# Patient Record
Sex: Male | Born: 1980 | Race: Black or African American | Hispanic: No | Marital: Single | Smoking: Former smoker
Health system: Southern US, Community
[De-identification: ages and names within clinical notes are randomized; demographics above are authoritative.]

## PROBLEM LIST (undated history)

## (undated) DIAGNOSIS — R42 Dizziness and giddiness: Secondary | ICD-10-CM

## (undated) DIAGNOSIS — G44009 Cluster headache syndrome, unspecified, not intractable: Secondary | ICD-10-CM

## (undated) DIAGNOSIS — I10 Essential (primary) hypertension: Secondary | ICD-10-CM

## (undated) HISTORY — PX: KNEE ARTHROSCOPY: SHX127

## (undated) HISTORY — DX: Essential (primary) hypertension: I10

## (undated) HISTORY — DX: Dizziness and giddiness: R42

## (undated) HISTORY — DX: Cluster headache syndrome, unspecified, not intractable: G44.009

---

## 1999-06-03 ENCOUNTER — Encounter: Payer: Self-pay | Admitting: Emergency Medicine

## 1999-06-03 ENCOUNTER — Emergency Department (HOSPITAL_COMMUNITY): Admission: EM | Admit: 1999-06-03 | Discharge: 1999-06-03 | Payer: Self-pay | Admitting: Emergency Medicine

## 1999-09-24 ENCOUNTER — Other Ambulatory Visit: Admission: RE | Admit: 1999-09-24 | Discharge: 1999-09-24 | Payer: Self-pay | Admitting: Podiatry

## 2002-09-03 ENCOUNTER — Emergency Department (HOSPITAL_COMMUNITY): Admission: EM | Admit: 2002-09-03 | Discharge: 2002-09-03 | Payer: Self-pay | Admitting: Emergency Medicine

## 2004-09-26 ENCOUNTER — Emergency Department (HOSPITAL_COMMUNITY): Admission: EM | Admit: 2004-09-26 | Discharge: 2004-09-26 | Payer: Self-pay | Admitting: Family Medicine

## 2007-02-03 ENCOUNTER — Emergency Department (HOSPITAL_COMMUNITY): Admission: EM | Admit: 2007-02-03 | Discharge: 2007-02-03 | Payer: Self-pay | Admitting: Emergency Medicine

## 2009-04-01 ENCOUNTER — Emergency Department (HOSPITAL_COMMUNITY): Admission: EM | Admit: 2009-04-01 | Discharge: 2009-04-01 | Payer: Self-pay | Admitting: Family Medicine

## 2009-04-01 ENCOUNTER — Emergency Department (HOSPITAL_COMMUNITY): Admission: EM | Admit: 2009-04-01 | Discharge: 2009-04-01 | Payer: Self-pay | Admitting: Emergency Medicine

## 2010-08-27 LAB — COMPREHENSIVE METABOLIC PANEL
ALT: 39 U/L (ref 0–53)
AST: 34 U/L (ref 0–37)
Albumin: 4.2 g/dL (ref 3.5–5.2)
Alkaline Phosphatase: 55 U/L (ref 39–117)
BUN: 12 mg/dL (ref 6–23)
CO2: 26 mEq/L (ref 19–32)
Calcium: 9.3 mg/dL (ref 8.4–10.5)
Chloride: 105 mEq/L (ref 96–112)
Creatinine, Ser: 1.21 mg/dL (ref 0.4–1.5)
GFR calc Af Amer: >60 mL/min (ref >60)
GFR calc non Af Amer: >60 mL/min (ref >60)
Glucose, Bld: 108 mg/dL — ABNORMAL HIGH (ref 70–99)
Potassium: 3.6 mEq/L (ref 3.5–5.1)
Sodium: 139 mEq/L (ref 135–145)
Total Bilirubin: 0.8 mg/dL (ref 0.3–1.2)
Total Protein: 7.8 g/dL (ref 6.0–8.3)

## 2010-08-27 LAB — CBC
HCT: 40.8 % (ref 39.0–52.0)
HCT: 42 % (ref 39.0–52.0)
Hemoglobin: 14 g/dL (ref 13.0–17.0)
Hemoglobin: 14.1 g/dL (ref 13.0–17.0)
MCHC: 33.6 g/dL (ref 30.0–36.0)
MCHC: 34.3 g/dL (ref 30.0–36.0)
MCV: 93.1 fL (ref 78.0–100.0)
Platelets: 234 10*3/uL (ref 150–400)
Platelets: 251 10*3/uL (ref 150–400)
RBC: 4.51 MIL/uL (ref 4.22–5.81)
RDW: 11.7 % (ref 11.5–15.5)
RDW: 11.9 % (ref 11.5–15.5)
WBC: 8.3 10*3/uL (ref 4.0–10.5)

## 2010-08-27 LAB — POCT I-STAT, CHEM 8
BUN: 14 mg/dL (ref 6–23)
Calcium, Ion: 1.12 mmol/L (ref 1.12–1.32)
HCT: 44 % (ref 39.0–52.0)
Hemoglobin: 15 g/dL (ref 13.0–17.0)
Sodium: 140 mEq/L (ref 135–145)
TCO2: 24 mmol/L (ref 0–100)

## 2010-08-27 LAB — POCT URINALYSIS DIP (DEVICE)
Ketones, ur: NEGATIVE mg/dL
Nitrite: NEGATIVE
Protein, ur: 30 mg/dL — AB
pH: 6 (ref 5.0–8.0)

## 2010-08-27 LAB — DIFFERENTIAL
Basophils Absolute: 0 10*3/uL (ref 0.0–0.1)
Basophils Absolute: 0 10*3/uL (ref 0.0–0.1)
Basophils Relative: 0 % (ref 0–1)
Basophils Relative: 0 % (ref 0–1)
Eosinophils Relative: 0 % (ref 0–5)
Eosinophils Relative: 0 % (ref 0–5)
Lymphocytes Relative: 5 % — ABNORMAL LOW (ref 12–46)
Monocytes Absolute: 0.2 10*3/uL (ref 0.1–1.0)
Monocytes Absolute: 0.5 10*3/uL (ref 0.1–1.0)
Monocytes Relative: 6 % (ref 3–12)

## 2018-08-17 ENCOUNTER — Ambulatory Visit: Payer: Self-pay | Admitting: Osteopathic Medicine

## 2018-08-17 DIAGNOSIS — G8911 Acute pain due to trauma: Secondary | ICD-10-CM | POA: Diagnosis not present

## 2018-08-17 DIAGNOSIS — M25512 Pain in left shoulder: Secondary | ICD-10-CM | POA: Diagnosis not present

## 2018-09-12 ENCOUNTER — Ambulatory Visit: Payer: Self-pay | Admitting: Osteopathic Medicine

## 2018-09-12 DIAGNOSIS — M79602 Pain in left arm: Secondary | ICD-10-CM | POA: Diagnosis not present

## 2018-09-12 DIAGNOSIS — J069 Acute upper respiratory infection, unspecified: Secondary | ICD-10-CM | POA: Diagnosis not present

## 2018-09-12 DIAGNOSIS — M25512 Pain in left shoulder: Secondary | ICD-10-CM | POA: Diagnosis not present

## 2018-09-12 DIAGNOSIS — R0789 Other chest pain: Secondary | ICD-10-CM | POA: Diagnosis not present

## 2018-09-12 DIAGNOSIS — G8929 Other chronic pain: Secondary | ICD-10-CM | POA: Diagnosis not present

## 2018-09-12 DIAGNOSIS — R079 Chest pain, unspecified: Secondary | ICD-10-CM | POA: Diagnosis not present

## 2018-09-29 DIAGNOSIS — M25512 Pain in left shoulder: Secondary | ICD-10-CM | POA: Diagnosis not present

## 2018-10-23 DIAGNOSIS — L739 Follicular disorder, unspecified: Secondary | ICD-10-CM | POA: Diagnosis not present

## 2018-10-23 DIAGNOSIS — R21 Rash and other nonspecific skin eruption: Secondary | ICD-10-CM | POA: Diagnosis not present

## 2018-10-26 ENCOUNTER — Encounter: Payer: Self-pay | Admitting: Osteopathic Medicine

## 2018-10-26 ENCOUNTER — Other Ambulatory Visit: Payer: Self-pay | Admitting: Osteopathic Medicine

## 2018-10-26 ENCOUNTER — Ambulatory Visit (INDEPENDENT_AMBULATORY_CARE_PROVIDER_SITE_OTHER): Payer: BC Managed Care – PPO | Admitting: Osteopathic Medicine

## 2018-10-26 VITALS — BP 143/83 | HR 87 | Ht 70.87 in | Wt 276.0 lb

## 2018-10-26 DIAGNOSIS — I10 Essential (primary) hypertension: Secondary | ICD-10-CM | POA: Diagnosis not present

## 2018-10-26 DIAGNOSIS — Z23 Encounter for immunization: Secondary | ICD-10-CM | POA: Diagnosis not present

## 2018-10-26 MED ORDER — LISINOPRIL-HYDROCHLOROTHIAZIDE 10-12.5 MG PO TABS: 1.0000 | ORAL_TABLET | Freq: Every day | ORAL | 1 refills | Status: DC

## 2018-10-26 NOTE — Patient Instructions (Addendum)
Plan:   Will start a medicine for blood pressure  Will recheck blood pressure (and headaches/dizziness) next week  Depending on numbers then, may need to make adjustments to the medicines   Please call/message [place] with any questions or concerns!

## 2018-10-26 NOTE — Progress Notes (Signed)
HPI: Shawn Richardson is a 38 y.o. male who  has a past medical history of Hypertension.  he presents to [facility] today, 10/26/18,  for chief complaint of: New to establish  HTN Dizziness DM2?   Pleasant new patient here to establish care. Works as a Geneticist, molecular.   HTN: Has been high for awhile. On no meds. Few ER visits - records reviewed, BP high then, too: 158/96 in 07/2018, 167/94 on 10/23/2018. reports orthostatic lightheadedness past couple weeks, as well as headache. No vision change, no chest pain.        Past medical, surgical, social and family history reviewed:  There are no active problems to display for this patient.   History reviewed. No pertinent surgical history.  Social History   Tobacco Use  . Smoking status: Never Smoker  . Smokeless tobacco: Never Used  Substance Use Topics  . Alcohol use: Yes    History reviewed. No pertinent family history.   Current medication list and allergy/intolerance information reviewed:    NO meds.   No Known Allergies    Review of Systems:  Constitutional:  No  fever, no chills, No recent illness, No unintentional weight changes. No significant fatigue.   HEENT: No  headache, no vision change, no hearing change, No sore throat, No  sinus pressure  Cardiac: No  chest pain, No  pressure, No palpitations, No  Orthopnea  Respiratory:  No  shortness of breath. No  Cough  Gastrointestinal: No  abdominal pain, No  nausea, No  vomiting,  No  blood in stool, No  diarrhea, No  constipation   Musculoskeletal: No new myalgia/arthralgia  Skin: No  Rash, No other wounds/concerning lesions  Genitourinary: No  incontinence, No  abnormal genital bleeding, No abnormal genital discharge  Hem/Onc: No  easy bruising/bleeding, No  abnormal lymph node  Endocrine: No cold intolerance,  No heat intolerance. No polyuria/polydipsia/polyphagia   Neurologic: No  weakness, No  dizziness,  No  slurred speech/focal weakness/facial droop  Psychiatric: No  concerns with depression, No  concerns with anxiety, No sleep problems, No mood problems  Exam:  BP (!) 143/83   Pulse 87   Ht 5' 10.87" (1.8 m)   Wt 276 lb (125.2 kg)   SpO2 98%   BMI 38.64 kg/m   Constitutional: VS see above. General Appearance: alert, well-developed, well-nourished, NAD  Eyes: Normal lids and conjunctive, non-icteric sclera  Neck: No masses, trachea midline. No thyroid enlargement. No tenderness/mass appreciated. No lymphadenopathy  Respiratory: Normal respiratory effort. no wheeze, no rhonchi, no rales  Cardiovascular: S1/S2 normal, no murmur, no rub/gallop auscultated. RRR. No lower extremity edema.   Gastrointestinal: Nontender, no masses.  Musculoskeletal: Gait normal. No clubbing/cyanosis of digits.   Neurological: Normal balance/coordination. No tremor.   Skin: warm, dry, intact. No rash/ulcer. No concerning nevi or subq nodules on limited exam.    Psychiatric: Normal judgment/insight. Normal mood and affect. Oriented x3.    Lab results reviewed from recent ER visit 09/12/2018 CBC, CMP were okay.  Glucose was 125 but patient states was probably not fasting.     ASSESSMENT/PLAN: The primary encounter diagnosis was Essential hypertension. A diagnosis of Need for tetanus, diphtheria, and acellular pertussis (Tdap) vaccine in patient of adolescent age or older was also pertinent to this visit.   Orders Placed This Encounter  Procedures  . Tdap vaccine greater than or equal to 7yo IM    Meds ordered this encounter  Medications  .  DISCONTD: lisinopril-hydrochlorothiazide (ZESTORETIC) 10-12.5 MG tablet    Sig: Take 1 tablet by mouth daily.    Dispense:  30 tablet    Refill:  1    Patient Instructions  Plan:   Will start a medicine for blood pressure  Will recheck blood pressure (and headaches/dizziness) next week  Depending on numbers then, may need to make adjustments to  the medicines   Please call/message us with any questions or concerns!          Visit summary with medication list and pertinent instructions was printed for patient to review. All questions at time of visit were answered - patient instructed to contact office with any additional concerns or updates. ER/RTC precautions were reviewed with the patient.     Please note: voice recognition software was used to produce this document, and typos may escape review. Please contact Dr. Lyn HollingsheadAlexander for any needed clarifications.     Follow-up plan: Return in about 1 week (around 11/02/2018) for recheck BP, sooner if needed .

## 2018-10-27 ENCOUNTER — Other Ambulatory Visit: Payer: Self-pay | Admitting: Osteopathic Medicine

## 2018-10-27 ENCOUNTER — Telehealth: Payer: Self-pay | Admitting: Osteopathic Medicine

## 2018-10-27 MED ORDER — HYDROCHLOROTHIAZIDE 12.5 MG PO TABS: 12.5000 mg | ORAL_TABLET | Freq: Every day | ORAL | 1 refills | Status: DC

## 2018-10-27 MED ORDER — LISINOPRIL 10 MG PO TABS: 10.0000 mg | ORAL_TABLET | Freq: Every day | ORAL | 1 refills | Status: DC

## 2018-10-27 NOTE — Telephone Encounter (Signed)
Pt aware.

## 2018-10-27 NOTE — Telephone Encounter (Signed)
Pt called. [facility] does not have the Lisinopril-HCTZ in stock.  Thanks

## 2018-10-27 NOTE — Telephone Encounter (Signed)
Sent separate prescriptions, patient okay to take both medications together

## 2018-10-28 ENCOUNTER — Encounter: Payer: Self-pay | Admitting: Osteopathic Medicine

## 2018-11-02 ENCOUNTER — Ambulatory Visit (INDEPENDENT_AMBULATORY_CARE_PROVIDER_SITE_OTHER): Payer: BC Managed Care – PPO | Admitting: Osteopathic Medicine

## 2018-11-02 ENCOUNTER — Encounter: Payer: Self-pay | Admitting: Osteopathic Medicine

## 2018-11-02 VITALS — BP 124/82 | HR 94 | Temp 98.0°F | Wt 279.0 lb

## 2018-11-02 DIAGNOSIS — G44201 Tension-type headache, unspecified, intractable: Secondary | ICD-10-CM

## 2018-11-02 DIAGNOSIS — I10 Essential (primary) hypertension: Secondary | ICD-10-CM

## 2018-11-02 DIAGNOSIS — R42 Dizziness and giddiness: Secondary | ICD-10-CM

## 2018-11-02 MED ORDER — ASPIRIN-ACETAMINOPHEN-CAFFEINE 250-250-65 MG PO TABS: 1.0000 | ORAL_TABLET | Freq: Three times a day (TID) | ORAL | 0 refills | Status: DC | PRN

## 2018-11-02 MED ORDER — MECLIZINE HCL 25 MG PO TABS: 25.0000 mg | ORAL_TABLET | Freq: Three times a day (TID) | ORAL | 0 refills | Status: DC | PRN

## 2018-11-02 NOTE — Patient Instructions (Addendum)
Plan:  We will trial some medications for headache/dizziness. I am not sure what to tell you might be the cause of your symptoms other than unusual presentation of tension headache and vertigo.    I think it would be smart to get an MRI of the brain and seek a second opinion from a neurologist.  Orders have been placed for the MRI test and a referral to a specialist. You should be getting a call to schedule these.  MRI will probably be next Sunday or Monday, neurology appointment may depend on results or on their availability but hopefully we can get you seen within the next couple of weeks.  If symptoms totally resolve, we can always cancel that appointment.

## 2018-11-02 NOTE — Progress Notes (Signed)
HPI: Shawn Richardson is a 38 y.o. male who  has a past medical history of Hypertension.  he presents to [facility] today, 11/02/18,  for chief complaint of:  Follow-up HTN  At last visit, we started lisinopril-hydrochlorothiazide 10-12.5.  Patient states he has been taking medications consistently.  Doing well except still experiencing constant vertiginous dizziness described as spinning, it's there all day, no change w/ movement. Reports headache at base of skull/top of head. No chest pain or vision change.  Patient states that the headache is new for the past 3 days, constant, not waking him up at night, not different with any positional change, he did mention a headache at his initial visit last week, but he states that there was no headache at that time.  BP Readings from Last 3 Encounters:  11/02/18 124/82  10/26/18 (!) 143/83        At today's visit 11/02/18 ... PMH, PSH, FH reviewed and updated as needed.  Current medication list and allergy/intolerance hx reviewed and updated as needed. (See remainder of HPI, ROS, Phys Exam below)  Vision screening abnormal, see nurse notes.  Corrected vision not terrible but uncorrected vision is pretty bad especially in the right eye      ASSESSMENT/PLAN: The primary encounter diagnosis was Vertigo. Diagnoses of Intractable tension-type headache, unspecified chronicity pattern and Essential hypertension were also pertinent to this visit.   Orders Placed This Encounter  Procedures  . MR Brain W Wo Contrast  . Ambulatory referral to Neurology     Meds ordered this encounter  Medications  . aspirin-acetaminophen-caffeine (EXCEDRIN MIGRAINE) 250-250-65 MG tablet    Sig: Take 1-2 tablets by mouth every 8 (eight) hours as needed for headache.    Dispense:  30 tablet    Refill:  0  . meclizine (ANTIVERT) 25 MG tablet    Sig: Take 1 tablet (25 mg total) by mouth 3 (three) times daily as needed for  dizziness.    Dispense:  30 tablet    Refill:  0    Patient Instructions  Plan:  We will trial some medications for headache/dizziness. I am not sure what to tell you might be the cause of your symptoms other than unusual presentation of tension headache and vertigo.    I think it would be smart to get an MRI of the brain and seek a second opinion from a neurologist.  Orders have been placed for the MRI test and a referral to a specialist. You should be getting a call to schedule these.  MRI will probably be next Sunday or Monday, neurology appointment may depend on results or on their availability but hopefully we can get you seen within the next couple of weeks.  If symptoms totally resolve, we can always cancel that appointment.     Follow-up plan: Return for recheck based on results of MRI / if headache gets worse or changes. Otherwise 1 month recheck BP .                                                 ################################################# ################################################# ################################################# #################################################    Current Meds  Medication Sig  . hydrochlorothiazide (HYDRODIURIL) 12.5 MG tablet Take 1 tablet (12.5 mg total) by mouth daily.  Marland Kitchen lisinopril (ZESTRIL) 10 MG tablet Take 1 tablet (10 mg total) by  mouth daily.    No Known Allergies     Review of Systems:  Constitutional: No recent illness  HEENT: +headache, no vision change  Cardiac: No  chest pain, No  pressure, No palpitations  Respiratory:  No  shortness of breath. No  Cough  Gastrointestinal: No  abdominal pain, no change on bowel habits  Musculoskeletal: No new myalgia/arthralgia  Skin: No  Rash  Hem/Onc: No  easy bruising/bleeding, No  abnormal lumps/bumps  Neurologic: No  weakness, +Dizziness  Psychiatric: No  concerns with depression, No  concerns with  anxiety  Exam:  BP 124/82 (BP Location: Left Arm, Patient Position: Sitting, Cuff Size: Large)   Pulse 94   Temp 98 F (36.7 C) (Oral)   Wt 279 lb (126.6 kg)   BMI 39.06 kg/m   BP Readings from Last 3 Encounters:  11/02/18 124/82  10/26/18 (!) 143/83    Constitutional: VS see above. General Appearance: alert, well-developed, well-nourished, NAD  Eyes: Normal lids and conjunctive, non-icteric sclera  Ears, Nose, Mouth, Throat: MMM, Normal external inspection ears/nares/mouth/lips/gums.  Neck: No masses, trachea midline.   Respiratory: Normal respiratory effort. no wheeze, no rhonchi, no rales  Cardiovascular: S1/S2 normal, no murmur, no rub/gallop auscultated. RRR.   Musculoskeletal: Gait normal. Symmetric and independent movement of all extremities  Neurological: Normal balance/coordination. No tremor.  EOMI, PERRLA, no nystagmus.  Normal finger-to-nose, normal heel-to-shin, is 5 out of 5 in all 4 extremities, cranial nerves grossly intact, Dix-Hallpike maneuver negative bilaterally for nystagmus or for reproduction of symptoms  Skin: warm, dry, intact.   Psychiatric: Normal judgment/insight. Normal mood and affect. Oriented x3.       Visit summary with medication list and pertinent instructions was printed for patient to review, patient was advised to alert us if any updates are needed. All questions at time of visit were answered - patient instructed to contact office with any additional concerns. ER/RTC precautions were reviewed with the patient and understanding verbalized.    Please note: voice recognition software was used to produce this document, and typos may escape review. Please contact Dr. Lyn HollingsheadAlexander for any needed clarifications.    Follow up plan: Return for recheck based on results of MRI / if headache gets worse or changes. Otherwise 1 month recheck BP .

## 2018-11-04 ENCOUNTER — Telehealth: Payer: Self-pay

## 2018-11-04 DIAGNOSIS — I10 Essential (primary) hypertension: Secondary | ICD-10-CM

## 2018-11-04 NOTE — Telephone Encounter (Signed)
MRI approved for bran w w/o contrast. Pt needs recent lab work due to hypertension.   Labs ordered and left pt msg advising to have done next week

## 2018-11-07 NOTE — Telephone Encounter (Addendum)
No, labs need to be in the last 6 weeks.Today is the 8 week mark

## 2018-11-07 NOTE — Telephone Encounter (Signed)
He has labs on file in care everywhere from 09/12/18 could they use these?

## 2018-11-07 NOTE — Telephone Encounter (Signed)
Well dang. I tried! Thanks!

## 2018-11-15 ENCOUNTER — Ambulatory Visit (INDEPENDENT_AMBULATORY_CARE_PROVIDER_SITE_OTHER): Payer: BC Managed Care – PPO | Admitting: Osteopathic Medicine

## 2018-11-15 ENCOUNTER — Encounter: Payer: Self-pay | Admitting: Osteopathic Medicine

## 2018-11-15 VITALS — BP 122/77 | HR 90 | Temp 98.7°F | Wt 276.5 lb

## 2018-11-15 DIAGNOSIS — R079 Chest pain, unspecified: Secondary | ICD-10-CM | POA: Diagnosis not present

## 2018-11-15 DIAGNOSIS — R071 Chest pain on breathing: Secondary | ICD-10-CM | POA: Diagnosis not present

## 2018-11-15 DIAGNOSIS — R0789 Other chest pain: Secondary | ICD-10-CM

## 2018-11-15 MED ORDER — MELOXICAM 7.5 MG PO TABS: 7.5000 mg | ORAL_TABLET | Freq: Every day | ORAL | 0 refills | Status: DC

## 2018-11-15 NOTE — Patient Instructions (Signed)

## 2018-11-15 NOTE — Progress Notes (Signed)
HPI: Shawn Richardson is a 38 y.o. male who  has a past medical history of Hypertension.  he presents to [facility] today, 11/15/18,  for chief complaint of:  Chest pain  . Context: 2 mos ago went to [facility] for cc "chest pain," Dx URI, thinks might have same issue. No known COVID exposure.  . Location: L anterior chest  . Quality: soreness . Duration: 4 days  . Timing: intermittent, NOT associated w/ exertion . Modifying factors: sometimes worse with coughing  . Assoc signs/symptoms: no fever/chills, no LE edema. +coughing on occasion.      At today's visit 11/15/18 ... PMH, PSH, FH reviewed and updated as needed.  Current medication list and allergy/intolerance hx reviewed and updated as needed. (See remainder of HPI, ROS, Phys Exam below)   No results found.  No results found for this or any previous visit (from the past 72 hour(s)).   EKG interpretation: Rate: 79 Rhythm: sinus No ST/T changes concerning for acute ischemia/infarct  Previous EKG no tracings available but normal report EKG 09/12/18        ASSESSMENT/PLAN: The primary encounter diagnosis was Costochondral chest pain. A diagnosis of Chest pain at rest was also pertinent to this visit.  Orders Placed This Encounter  Procedures  . EKG 12-Lead     Meds ordered this encounter  Medications  . meloxicam (MOBIC) 7.5 MG tablet    Sig: Take 1 tablet (7.5 mg total) by mouth daily.    Dispense:  30 tablet    Refill:  0    Patient Instructions  Costochondritis Costochondritis is swelling and irritation (inflammation) of the tissue (cartilage) that connects your ribs to your breastbone (sternum). This causes pain in the front of your chest. Usually, the pain:  Starts gradually.  Is in more than one rib. This condition usually goes away on its own over time. Follow these instructions at home:  Do not do anything that makes your pain worse.  If directed, put ice  on the painful area: ? Put ice in a plastic bag. ? Place a towel between your skin and the bag. ? Leave the ice on for 20 minutes, 2-3 times a day.  If directed, put heat on the affected area as often as told by your doctor. Use the heat source that your doctor tells you to use, such as a moist heat pack or a heating pad. ? Place a towel between your skin and the heat source. ? Leave the heat on for 20-30 minutes. ? Take off the heat if your skin turns bright red. This is very important if you cannot feel pain, heat, or cold. You may have a greater risk of getting burned.  Take over-the-counter and prescription medicines only as told by your doctor.  Return to your normal activities as told by your doctor. Ask your doctor what activities are safe for you.  Keep all follow-up visits as told by your doctor. This is important. Contact a doctor if:  You have chills or a fever.  Your pain does not go away or it gets worse.  You have a cough that does not go away. Get help right away if:  You are short of breath. This information is not intended to replace advice given to you by your health care provider. Make sure you discuss any questions you have with your health care provider. Document Released: 10/28/2007 Document Revised: 11/29/2015 Document Reviewed: 09/04/2015 Elsevier Interactive Patient Education  2019 [employer]      Follow-up plan: Return if symptoms worsen or fail to improve please call us!Marland Kitchen.                                                 ################################################# ################################################# ################################################# #################################################    No outpatient medications have been marked as taking for the 11/15/18 encounter (Office Visit) with Sunnie NielsenAlexander, Cecilia Vancleve, DO.    No Known Allergies     Review of Systems:   Constitutional: No recent illness  HEENT: No  headache, no vision change  Cardiac: +chest pain, No  pressure, No palpitations  Respiratory:  No  shortness of breath. +Cough  Gastrointestinal: No  abdominal pain, no change on bowel habits  Musculoskeletal: No new myalgia/arthralgia  Skin: No  Rash  Neurologic: No  weakness, No  Dizziness   Exam:  BP 122/77 (BP Location: Left Arm, Patient Position: Sitting, Cuff Size: Large)   Pulse 90   Temp 98.7 F (37.1 C) (Oral)   Wt 276 lb 8 oz (125.4 kg)   BMI 38.71 kg/m   Constitutional: VS see above. General Appearance: alert, well-developed, well-nourished, NAD  Eyes: Normal lids and conjunctive, non-icteric sclera  Ears, Nose, Mouth, Throat: MMM, Normal external inspection ears/nares/mouth/lips/gums.  Neck: No masses, trachea midline.   Respiratory: Normal respiratory effort. no wheeze, no rhonchi, no rales  Cardiovascular: S1/S2 normal, no murmur, no rub/gallop auscultated. RRR.   Musculoskeletal: Gait normal. Symmetric and independent movement of all extremities. (+)tenderness to chest wall at area of pain on L sternal border, worse w/ deep breath  Neurological: Normal balance/coordination. No tremor.  Skin: warm, dry, intact.   Psychiatric: Normal judgment/insight. Normal mood and affect. Oriented x3.       Visit summary with medication list and pertinent instructions was printed for patient to review, patient was advised to alert us if any updates are needed. All questions at time of visit were answered - patient instructed to contact office with any additional concerns. ER/RTC precautions were reviewed with the patient and understanding verbalized.    Please note: voice recognition software was used to produce this document, and typos may escape review. Please contact Dr. Lyn HollingsheadAlexander for any needed clarifications.    Follow up plan: Return if symptoms worsen or fail to improve please call us!.Marland Kitchen

## 2018-11-23 ENCOUNTER — Other Ambulatory Visit: Payer: Self-pay | Admitting: Osteopathic Medicine

## 2018-11-26 DIAGNOSIS — R0789 Other chest pain: Secondary | ICD-10-CM | POA: Diagnosis not present

## 2018-11-26 DIAGNOSIS — I10 Essential (primary) hypertension: Secondary | ICD-10-CM | POA: Diagnosis not present

## 2018-11-26 DIAGNOSIS — M940 Chondrocostal junction syndrome [Tietze]: Secondary | ICD-10-CM | POA: Diagnosis not present

## 2018-11-26 DIAGNOSIS — S29011A Strain of muscle and tendon of front wall of thorax, initial encounter: Secondary | ICD-10-CM | POA: Diagnosis not present

## 2018-11-28 ENCOUNTER — Other Ambulatory Visit: Payer: Self-pay | Admitting: Osteopathic Medicine

## 2018-12-06 ENCOUNTER — Ambulatory Visit (INDEPENDENT_AMBULATORY_CARE_PROVIDER_SITE_OTHER): Payer: BC Managed Care – PPO | Admitting: Osteopathic Medicine

## 2018-12-06 ENCOUNTER — Telehealth: Payer: Self-pay | Admitting: Osteopathic Medicine

## 2018-12-06 ENCOUNTER — Other Ambulatory Visit: Payer: Self-pay

## 2018-12-06 ENCOUNTER — Encounter: Payer: Self-pay | Admitting: Osteopathic Medicine

## 2018-12-06 DIAGNOSIS — Z20822 Contact with and (suspected) exposure to covid-19: Secondary | ICD-10-CM

## 2018-12-06 DIAGNOSIS — R6889 Other general symptoms and signs: Secondary | ICD-10-CM | POA: Diagnosis not present

## 2018-12-06 DIAGNOSIS — Z20828 Contact with and (suspected) exposure to other viral communicable diseases: Secondary | ICD-10-CM

## 2018-12-06 NOTE — Addendum Note (Signed)
Addended by: Maryla Morrow on: 12/06/2018 09:43 AM   Modules accepted: Orders

## 2018-12-06 NOTE — Telephone Encounter (Signed)
Requests COVID testing, please arrange or advise him where he can go w/o appointment

## 2018-12-06 NOTE — Progress Notes (Signed)
Virtual Visit via Video (App used: Doximity) Note  I connected with      Shawn Richardson on 12/06/18 at 7:15 AM by a telemedicine application and verified that I am speaking with the correct person using two identifiers.  Patient is in his car in the parking lot after (+)answers for COVID precautions screening  I am in the office    I discussed the limitations of evaluation and management by telemedicine and the availability of in person appointments. The patient expressed understanding and agreed to proceed.  History of Present Illness: Shawn Richardson is a 38 y.o. male who would like to discuss follow-up headache/dizziness and BP, getting tested for COVID    Still having chest pains and headaches. Pt requests COVID testing. No fever or coughing. No home BP to report. Has appt w/ neurology but hasn't set up MRI       Observations/Objective: There were no vitals taken for this visit. BP Readings from Last 3 Encounters:  11/15/18 122/77  11/02/18 124/82  10/26/18 (!) 143/83   Exam: Normal Speech.  NAD  Lab and Radiology Results No results found for this or any previous visit (from the past 72 hour(s)). No results found.     Assessment and Plan: 38 y.o. male with The encounter diagnosis was Suspected Covid-19 Virus Infection.  Sent for drive-through testing for COVID  Will ask radiology to set up appt for MRI pending COVID results or can defer to neurology  PDMP not reviewed this encounter. Orders Placed This Encounter  Procedures  . Novel Coronavirus, NAA ([facility])  Drive up testing site only    Order Specific Question:   Known Exposure    Answer:   No   No orders of the defined types were placed in this encounter.  There are no Patient Instructions on file for this visit.     Follow Up Instructions: Return for RECHECK PENDING RESULTS / IF WORSE OR CHANGE.    I discussed the assessment and treatment plan with the patient. The patient was provided an  opportunity to ask questions and all were answered. The patient agreed with the plan and demonstrated an understanding of the instructions.   The patient was advised to call back or seek an in-person evaluation if any new concerns, if symptoms worsen or if the condition fails to improve as anticipated.  15 minutes of non-face-to-face time was provided during this encounter.                      Historical information moved to improve visibility of documentation.  Past Medical History:  Diagnosis Date  . Hypertension    No past surgical history on file. Social History   Tobacco Use  . Smoking status: Never Smoker  . Smokeless tobacco: Never Used  Substance Use Topics  . Alcohol use: Yes   family history is not on file.  Medications: Current Outpatient Medications  Medication Sig Dispense Refill  . aspirin-acetaminophen-caffeine (EXCEDRIN MIGRAINE) 250-250-65 MG tablet Take 1-2 tablets by mouth every 8 (eight) hours as needed for headache. 30 tablet 0  . hydrochlorothiazide (HYDRODIURIL) 12.5 MG tablet TAKE 1 TABLET(12.5 MG) BY MOUTH DAILY 90 tablet 1  . lisinopril (ZESTRIL) 10 MG tablet TAKE 1 TABLET(10 MG) BY MOUTH DAILY 90 tablet 1  . meclizine (ANTIVERT) 25 MG tablet Take 1 tablet (25 mg total) by mouth 3 (three) times daily as needed for dizziness. 30 tablet 0  . meloxicam (MOBIC) 7.5  MG tablet Take 1 tablet (7.5 mg total) by mouth daily. 30 tablet 0   No current facility-administered medications for this visit.    No Known Allergies  PDMP not reviewed this encounter. Orders Placed This Encounter  Procedures  . Novel Coronavirus, NAA ([facility])  Drive up testing site only    Order Specific Question:   Known Exposure    Answer:   No   No orders of the defined types were placed in this encounter.

## 2018-12-06 NOTE — Telephone Encounter (Signed)
Attempted to reach pt, no answer. Left vm to call back M-F 7am-7pm  Re-ordered lab as it needs to be future for the testing sites

## 2018-12-07 ENCOUNTER — Other Ambulatory Visit: Payer: BC Managed Care – PPO

## 2018-12-10 LAB — NOVEL CORONAVIRUS, NAA: SARS-CoV-2, NAA: NOT DETECTED

## 2018-12-12 ENCOUNTER — Other Ambulatory Visit: Payer: Self-pay | Admitting: Osteopathic Medicine

## 2018-12-12 NOTE — Telephone Encounter (Signed)
Please review for refill- patient at [facility] 

## 2018-12-21 ENCOUNTER — Telehealth: Payer: Self-pay | Admitting: Neurology

## 2018-12-21 ENCOUNTER — Ambulatory Visit: Payer: BC Managed Care – PPO | Admitting: Neurology

## 2018-12-21 NOTE — Telephone Encounter (Signed)
This patient did not show for new patient appointment today. 

## 2018-12-22 ENCOUNTER — Other Ambulatory Visit: Payer: Self-pay | Admitting: Osteopathic Medicine

## 2018-12-23 ENCOUNTER — Other Ambulatory Visit: Payer: Self-pay

## 2018-12-23 ENCOUNTER — Encounter: Payer: Self-pay | Admitting: Family Medicine

## 2018-12-23 ENCOUNTER — Other Ambulatory Visit: Payer: Self-pay | Admitting: *Deleted

## 2018-12-23 ENCOUNTER — Ambulatory Visit (INDEPENDENT_AMBULATORY_CARE_PROVIDER_SITE_OTHER): Payer: BC Managed Care – PPO | Admitting: Family Medicine

## 2018-12-23 VITALS — BP 137/81 | HR 80 | Ht 71.0 in | Wt 278.0 lb

## 2018-12-23 DIAGNOSIS — R071 Chest pain on breathing: Secondary | ICD-10-CM

## 2018-12-23 DIAGNOSIS — R0789 Other chest pain: Secondary | ICD-10-CM

## 2018-12-23 MED ORDER — PREDNISONE 20 MG PO TABS: 40.0000 mg | ORAL_TABLET | Freq: Every day | ORAL | 0 refills | Status: AC

## 2018-12-23 NOTE — Patient Instructions (Signed)
Costochondritis  Costochondritis is swelling and irritation (inflammation) of the tissue (cartilage) that connects your ribs to your breastbone (sternum). This causes pain in the front of your chest. The pain usually starts gradually and involves more than one rib. What are the causes? The exact cause of this condition is not always known. It results from stress on the cartilage where your ribs attach to your sternum. The cause of this stress could be:  Chest injury (trauma).  Exercise or activity, such as lifting.  Severe coughing. What increases the risk? You may be at higher risk for this condition if you:  Are male.  Are 30?40 years old.  Recently started a new exercise or work activity.  Have low levels of vitamin D.  Have a condition that makes you cough frequently. What are the signs or symptoms? The main symptom of this condition is chest pain. The pain:  Usually starts gradually and can be sharp or dull.  Gets worse with deep breathing, coughing, or exercise.  Gets better with rest.  May be worse when you press on the sternum-rib connection (tenderness). How is this diagnosed? This condition is diagnosed based on your symptoms, medical history, and a physical exam. Your health care provider will check for tenderness when pressing on your sternum. This is the most important finding. You may also have tests to rule out other causes of chest pain. These may include:  A chest X-ray to check for lung problems.  An electrocardiogram (ECG) to see if you have a heart problem that could be causing the pain.  An imaging scan to rule out a chest or rib fracture. How is this treated? This condition usually goes away on its own over time. Your health care provider may prescribe an NSAID to reduce pain and inflammation. Your health care provider may also suggest that you:  Rest and avoid activities that make pain worse.  Apply heat or cold to the area to reduce pain and  inflammation.  Do exercises to stretch your chest muscles. If these treatments do not help, your health care provider may inject a numbing medicine at the sternum-rib connection to help relieve the pain. Follow these instructions at home:  Avoid activities that make pain worse. This includes any activities that use chest, abdominal, and side muscles.  If directed, put ice on the painful area: ? Put ice in a plastic bag. ? Place a towel between your skin and the bag. ? Leave the ice on for 20 minutes, 2-3 times a day.  If directed, apply heat to the affected area as often as told by your health care provider. Use the heat source that your health care provider recommends, such as a moist heat pack or a heating pad. ? Place a towel between your skin and the heat source. ? Leave the heat on for 20-30 minutes. ? Remove the heat if your skin turns bright red. This is especially important if you are unable to feel pain, heat, or cold. You may have a greater risk of getting burned.  Take over-the-counter and prescription medicines only as told by your health care provider.  Return to your normal activities as told by your health care provider. Ask your health care provider what activities are safe for you.  Keep all follow-up visits as told by your health care provider. This is important. Contact a health care provider if:  You have chills or a fever.  Your pain does not go away or it gets   worse.  You have a cough that does not go away (is persistent). Get help right away if:  You have shortness of breath. This information is not intended to replace advice given to you by your health care provider. Make sure you discuss any questions you have with your health care provider. Document Released: 02/18/2005 Document Revised: 05/26/2017 Document Reviewed: 09/04/2015 Elsevier Patient Education  2020 Elsevier Inc.  

## 2018-12-23 NOTE — Progress Notes (Signed)
Acute Office Visit  Subjective:    Patient ID: Shawn Richardson, male    DOB: Sep 21, 1980, 38 y.o.   MRN: [id number]  Chief Complaint  Patient presents with  . Chest Pain    HPI Patient is in today for left sided chest pain that has been going on for a couple of months.  He is been seen for it previously in fact he has gone to urgent care and also seen his primary care provider here in our office.  He has had a chest x-ray and EKG.  Cardiac and pulmonary disease have been ruled out.  And almost days just on the chest wall on the left side.  No radiation.  No numbness or tingling in the hands.  No shortness of breath.  No chest tightness.  No lightheadedness or dizziness.  He was given a prescription for meloxicam and says it actually really was helpful.  He came back in today bc he would like a refill on the medication.  Denies worsening symptoms with eating.  Denies any heartburn or reflux symptoms.  Past Medical History:  Diagnosis Date  . Hypertension     No past surgical history on file.  No family history on file.  Social History   Socioeconomic History  . Marital status: Single    Spouse name: Not on file  . Number of children: Not on file  . Years of education: Not on file  . Highest education level: Not on file  Occupational History  . Not on file  Social Needs  . Financial resource strain: Not on file  . Food insecurity    Worry: Not on file    Inability: Not on file  . Transportation needs    Medical: Not on file    Non-medical: Not on file  Tobacco Use  . Smoking status: Never Smoker  . Smokeless tobacco: Never Used  Substance and Sexual Activity  . Alcohol use: Yes  . Drug use: Never  . Sexual activity: Yes  Lifestyle  . Physical activity    Days per week: Not on file    Minutes per session: Not on file  . Stress: Not on file  Relationships  . Social Herbalist on phone: Not on file    Gets together: Not on file    Attends religious  service: Not on file    Active member of club or organization: Not on file    Attends meetings of clubs or organizations: Not on file    Relationship status: Not on file  . Intimate partner violence    Fear of current or ex partner: Not on file    Emotionally abused: Not on file    Physically abused: Not on file    Forced sexual activity: Not on file  Other Topics Concern  . Not on file  Social History Narrative  . Not on file    Outpatient Medications Prior to Visit  Medication Sig Dispense Refill  . aspirin-acetaminophen-caffeine (EXCEDRIN MIGRAINE) 250-250-65 MG tablet Take 1-2 tablets by mouth every 8 (eight) hours as needed for headache. 30 tablet 0  . hydrochlorothiazide (HYDRODIURIL) 12.5 MG tablet TAKE 1 TABLET(12.5 MG) BY MOUTH DAILY 90 tablet 1  . lisinopril (ZESTRIL) 10 MG tablet TAKE 1 TABLET(10 MG) BY MOUTH DAILY 90 tablet 1  . meloxicam (MOBIC) 7.5 MG tablet TAKE 1 TABLET(7.5 MG) BY MOUTH DAILY 30 tablet 0  . meclizine (ANTIVERT) 25 MG tablet Take 1 tablet (25  mg total) by mouth 3 (three) times daily as needed for dizziness. 30 tablet 0  . methocarbamol (ROBAXIN) 750 MG tablet      No facility-administered medications prior to visit.     No Known Allergies  ROS     Objective:    Physical Exam  Constitutional: He is oriented to person, place, and time. He appears well-developed and well-nourished.  HENT:  Head: Normocephalic and atraumatic.  Right Ear: External ear normal.  Left Ear: External ear normal.  Eyes: Conjunctivae are normal.  Neck: Neck supple.  Cardiovascular: Normal rate, regular rhythm and normal heart sounds.  Pulmonary/Chest: Effort normal and breath sounds normal. He exhibits tenderness.  He is tender over the left chest wall where the ribs meet the sternum with palpitation.   Neurological: He is alert and oriented to person, place, and time.  Skin: Skin is warm and dry.  Psychiatric: He has a normal mood and affect. His behavior is normal.  Thought content normal.    BP 137/81   Pulse 80   Ht 5\' 11"  (1.803 m)   Wt 278 lb (126.1 kg)   SpO2 100%   BMI 38.77 kg/m  Wt Readings from Last 3 Encounters:  12/23/18 278 lb (126.1 kg)  11/15/18 276 lb 8 oz (125.4 kg)  11/02/18 279 lb (126.6 kg)    Health Maintenance Due  Topic Date Due  . HIV Screening  02/03/1995    There are no preventive care reminders to display for this patient.   No results found for: TSH Lab Results  Component Value Date   WBC 12.1 (H) 04/01/2009   HGB 15.0 04/01/2009   HCT 44.0 04/01/2009   MCV 92.0 04/01/2009   PLT 251 04/01/2009   Lab Results  Component Value Date   NA 140 04/01/2009   K 3.9 04/01/2009   CO2 26 04/01/2009   GLUCOSE 115 (H) 04/01/2009   BUN 14 04/01/2009   CREATININE 1.2 04/01/2009   BILITOT 0.8 04/01/2009   ALKPHOS 55 04/01/2009   AST 34 04/01/2009   ALT 39 04/01/2009   PROT 7.8 04/01/2009   ALBUMIN 4.2 04/01/2009   CALCIUM 9.3 04/01/2009   No results found for: CHOL No results found for: HDL No results found for: LDLCALC No results found for: TRIG No results found for: CHOLHDL No results found for: [id number]     Assessment & Plan:   Problem List Items Addressed This Visit    None    Visit Diagnoses    Costochondral chest pain    -  Primary     Do feel reassured that his pain is most consistent with costochondritis.  It looks like his medication was actually refilled about a week ago and we did call the pharmacy to confirm that they do have a prescription.  We also discussed maybe a short-term use of prednisone to see if that would be helpful.  I just encouraged him to hold off on using the meloxicam until he finishes the prednisone.  Did warn about potential side effects.  Call if any problems or if not improving over the next month please let me know.  Did recommend that he avoid any heavy lifting.  He says he does do some heavy lifting periodically.  Meds ordered this encounter  Medications  .  predniSONE (DELTASONE) 20 MG tablet    Sig: Take 2 tablets (40 mg total) by mouth daily with breakfast for 5 days.    Dispense:  10 tablet  Refill:  0     Beatrice Lecher, MD

## 2018-12-23 NOTE — Progress Notes (Signed)
perPt reports that he has Costochondral CP. He stated that the pain comes and goes, denies any SOB, pain radiating from jaw, neck or arm, no  f/s/c/n/v/d, no issues w/issues after eating or drinking.  He stated that he was given Mobic 7.5 mg for this to take qd and this helped him with the pain and he ran out and the pain returned. I asked if he had tried IBU he stated that he did and that it didn't work as well as the [employer]. I advised him that his last refill was sent on 12/12/2018 he said that he was unaware of this.  Called the pharmacy and the script is ready for p/u.Elouise Munroe, [place]

## 2019-01-19 ENCOUNTER — Other Ambulatory Visit: Payer: Self-pay | Admitting: Osteopathic Medicine

## 2019-01-26 ENCOUNTER — Other Ambulatory Visit: Payer: Self-pay

## 2019-01-26 ENCOUNTER — Encounter: Payer: Self-pay | Admitting: Osteopathic Medicine

## 2019-01-26 ENCOUNTER — Ambulatory Visit (INDEPENDENT_AMBULATORY_CARE_PROVIDER_SITE_OTHER): Payer: BC Managed Care – PPO | Admitting: Osteopathic Medicine

## 2019-01-26 VITALS — BP 134/81 | HR 81 | Temp 98.4°F | Wt 274.3 lb

## 2019-01-26 DIAGNOSIS — R1011 Right upper quadrant pain: Secondary | ICD-10-CM | POA: Diagnosis not present

## 2019-01-26 DIAGNOSIS — R1032 Left lower quadrant pain: Secondary | ICD-10-CM

## 2019-01-26 DIAGNOSIS — A0472 Enterocolitis due to Clostridium difficile, not specified as recurrent: Secondary | ICD-10-CM

## 2019-01-26 DIAGNOSIS — R197 Diarrhea, unspecified: Secondary | ICD-10-CM

## 2019-01-26 NOTE — Patient Instructions (Signed)
Plan:  Prolonged issues with loose stool give [place] concern for potential infection of the colon such as colitis or diverticulitis, possibly infection with parasite or C. difficile colitis.  Given the location of the right upper quadrant pain to however, I am suspicious for possible liver/gallbladder illness or even a pancreatic inflammation.  Like to get some blood work today as well as stool studies to evaluate for potential infection.  I like to get a CT scan of the abdomen done today or tomorrow so that we can look at the organs  Okay to use Imodium/loperamide as directed for now.  Stay well-hydrated.  Further treatment will depend on test results!  If severe abdominal pain, especially if fever, nausea/vomiting, please seek emergency medical attention if needed.

## 2019-01-26 NOTE — Progress Notes (Signed)
HPI: Shawn Richardson is a 38 y.o. male who  has a past medical history of Hypertension.  he presents to [facility] today, 01/26/19,  for chief complaint of:  Abdominal pain, diarrhea  . Context: No recent travel, changes to diet, or other triggers he can think of . Location/Quality: Bowel movements with loose stool, no blood or black stool, bowel movements about 4-5 times per day.  Associated with sharp intermittent right upper quadrant pain, duller and less frequent left lower quadrant pain.  No rectal pain with defecation. . Duration: About 3 weeks . Modifying factors: Over-the-counter medications tried     At today's visit 01/26/19 ... PMH, PSH, FH reviewed and updated as needed.  Current medication list and allergy/intolerance hx reviewed and updated as needed. (See remainder of HPI, ROS, Phys Exam below)   No results found.  No results found for this or any previous visit (from the past 72 hour(s)).        ASSESSMENT/PLAN: The primary encounter diagnosis was Right upper quadrant abdominal pain. Diagnoses of Left lower quadrant abdominal pain and Diarrhea of presumed infectious origin were also pertinent to this visit.   Orders Placed This Encounter  Procedures  . Stool Giardia/Cryptosporidium  . Ova and parasite examination  . CT Abdomen Pelvis W Contrast  . CBC with Differential/Platelet  . COMPLETE METABOLIC PANEL WITH GFR  . Gamma GT  . Lipase  . Stool, WBC/Lactoferrin  . C. difficile GDH and Toxin A/B     No orders of the defined types were placed in this encounter.   Patient Instructions  Plan:  Prolonged issues with loose stool give [place] concern for potential infection of the colon such as colitis or diverticulitis, possibly infection with parasite or C. difficile colitis.  Given the location of the right upper quadrant pain to however, I am suspicious for possible liver/gallbladder illness or even a pancreatic  inflammation.  Like to get some blood work today as well as stool studies to evaluate for potential infection.  I like to get a CT scan of the abdomen done today or tomorrow so that we can look at the organs  Okay to use Imodium/loperamide as directed for now.  Stay well-hydrated.  Further treatment will depend on test results!  If severe abdominal pain, especially if fever, nausea/vomiting, please seek emergency medical attention if needed.       Follow-up plan: Return if symptoms worsen or fail to improve / depending on results.                                                 ################################################# ################################################# ################################################# #################################################    Current Meds  Medication Sig  . aspirin-acetaminophen-caffeine (EXCEDRIN MIGRAINE) [phone] MG tablet Take 1-2 tablets by mouth every 8 (eight) hours as needed for headache.  . hydrochlorothiazide (HYDRODIURIL) 12.5 MG tablet TAKE 1 TABLET(12.5 MG) BY MOUTH DAILY  . lisinopril (ZESTRIL) 10 MG tablet TAKE 1 TABLET(10 MG) BY MOUTH DAILY  . meloxicam (MOBIC) 7.5 MG tablet TAKE 1 TABLET(7.5 MG) BY MOUTH DAILY    No Known Allergies     Review of Systems:  Constitutional: No recent illness  HEENT: No  headache, no vision change  Cardiac: No  chest pain, No  pressure, No palpitations  Respiratory:  No  shortness of breath. No  Cough  Gastrointestinal: +abdominal pain, +change on bowel habits, NO nausea or vomiting,  Musculoskeletal: No new myalgia/arthralgia  Skin: No  Rash  Neurologic: No  weakness, No  Dizziness   Exam:  BP 134/81 (BP Location: Left Arm, Patient Position: Sitting, Cuff Size: Large)   Pulse 81   Temp 98.4 F (36.9 C) (Oral)   Wt 274 lb 4.8 oz (124.4 kg)   BMI 38.26 kg/m   Constitutional: VS see above. General Appearance:  alert, well-developed, well-nourished, NAD  Eyes: Normal lids and conjunctive, non-icteric sclera  Ears, Nose, Mouth, Throat: MMM, Normal external inspection ears/nares/mouth/lips/gums.  Neck: No masses, trachea midline.   Respiratory: Normal respiratory effort. no wheeze, no rhonchi, no rales  Cardiovascular: S1/S2 normal, no murmur, no rub/gallop auscultated. RRR.   Musculoskeletal: Gait normal. Symmetric and independent movement of all extremities  Abdominal: (+)TTP RUQ and LLQ wihtout rebound or guarding, non-distended, no appreciable organomegaly though habitus limits exam. neg Murphy's, BS WNLx4  Neurological: Normal balance/coordination. No tremor.  Skin: warm, dry, intact.   Psychiatric: Normal judgment/insight. Normal mood and affect. Oriented x3.       Visit summary with medication list and pertinent instructions was printed for patient to review, patient was advised to alert us if any updates are needed. All questions at time of visit were answered - patient instructed to contact office with any additional concerns. ER/RTC precautions were reviewed with the patient and understanding verbalized.    Please note: voice recognition software was used to produce this document, and typos may escape review. Please contact Dr. Lyn HollingsheadAlexander for any needed clarifications.    Follow up plan: Return if symptoms worsen or fail to improve / depending on results.

## 2019-01-27 ENCOUNTER — Ambulatory Visit (INDEPENDENT_AMBULATORY_CARE_PROVIDER_SITE_OTHER): Payer: BC Managed Care – PPO

## 2019-01-27 ENCOUNTER — Other Ambulatory Visit: Payer: Self-pay

## 2019-01-27 DIAGNOSIS — R1032 Left lower quadrant pain: Secondary | ICD-10-CM

## 2019-01-27 DIAGNOSIS — R197 Diarrhea, unspecified: Secondary | ICD-10-CM | POA: Diagnosis not present

## 2019-01-27 DIAGNOSIS — R1011 Right upper quadrant pain: Secondary | ICD-10-CM | POA: Diagnosis not present

## 2019-01-27 LAB — COMPLETE METABOLIC PANEL WITH GFR
AG Ratio: 1.4 (calc) (ref 1.0–2.5)
ALT: 38 U/L (ref 9–46)
AST: 23 U/L (ref 10–40)
Albumin: 4.1 g/dL (ref 3.6–5.1)
Alkaline phosphatase (APISO): 57 U/L (ref 36–130)
BUN: 14 mg/dL (ref 7–25)
CO2: 28 mmol/L (ref 20–32)
Calcium: 9.7 mg/dL (ref 8.6–10.3)
Chloride: 104 mmol/L (ref 98–110)
Creat: 1.26 mg/dL (ref 0.60–1.35)
GFR, Est African American: 83 mL/min/{1.73_m2} (ref >=60)
GFR, Est Non African American: 72 mL/min/{1.73_m2} (ref >=60)
Globulin: 2.9 g/dL (calc) (ref 1.9–3.7)
Glucose, Bld: 116 mg/dL — ABNORMAL HIGH (ref 65–99)
Potassium: 4.9 mmol/L (ref 3.5–5.3)
Sodium: 138 mmol/L (ref 135–146)
Total Bilirubin: 0.6 mg/dL (ref 0.2–1.2)
Total Protein: 7 g/dL (ref 6.1–8.1)

## 2019-01-27 LAB — CBC WITH DIFFERENTIAL/PLATELET
Absolute Monocytes: 582 cells/uL (ref 200–950)
Basophils Absolute: 19 cells/uL (ref 0–200)
Basophils Relative: 0.3 %
Eosinophils Absolute: 13 cells/uL — ABNORMAL LOW (ref 15–500)
Eosinophils Relative: 0.2 %
HCT: 40.7 % (ref 38.5–50.0)
Hemoglobin: 13 g/dL — ABNORMAL LOW (ref 13.2–17.1)
Lymphs Abs: 1408 cells/uL (ref 850–3900)
MCH: 29.4 pg (ref 27.0–33.0)
MCHC: 31.9 g/dL — ABNORMAL LOW (ref 32.0–36.0)
MCV: 92.1 fL (ref 80.0–100.0)
MPV: 10.2 fL (ref 7.5–12.5)
Monocytes Relative: 9.1 %
Neutro Abs: 4378 cells/uL (ref 1500–7800)
Neutrophils Relative %: 68.4 %
Platelets: 352 10*3/uL (ref 140–400)
RBC: 4.42 10*6/uL (ref 4.20–5.80)
RDW: 11.4 % (ref 11.0–15.0)
Total Lymphocyte: 22 %
WBC: 6.4 10*3/uL (ref 3.8–10.8)

## 2019-01-27 LAB — LIPASE: Lipase: 28 U/L (ref 7–60)

## 2019-01-27 LAB — GAMMA GT: GGT: 30 U/L (ref 3–90)

## 2019-01-27 MED ORDER — IOHEXOL 300 MG/ML  SOLN
100.0000 mL | Freq: Once | INTRAMUSCULAR | Status: AC | PRN
  Administered 2019-01-27: 11:00:00 100 mL via INTRAVENOUS

## 2019-01-29 ENCOUNTER — Encounter: Payer: Self-pay | Admitting: Osteopathic Medicine

## 2019-01-31 DIAGNOSIS — A0472 Enterocolitis due to Clostridium difficile, not specified as recurrent: Secondary | ICD-10-CM | POA: Insufficient documentation

## 2019-01-31 MED ORDER — VANCOMYCIN HCL 125 MG PO CAPS: 125.0000 mg | ORAL_CAPSULE | Freq: Four times a day (QID) | ORAL | 0 refills | Status: DC

## 2019-01-31 NOTE — Addendum Note (Signed)
Addended by: Maryla Morrow on: 01/31/2019 10:26 AM   Modules accepted: Orders

## 2019-02-01 ENCOUNTER — Encounter: Payer: Self-pay | Admitting: Osteopathic Medicine

## 2019-02-01 LAB — C. DIFFICILE GDH AND TOXIN A/B
GDH ANTIGEN: DETECTED
MICRO NUMBER:: 850349
SPECIMEN QUALITY:: ADEQUATE
TOXIN A AND B: NOT DETECTED

## 2019-02-01 LAB — OVA AND PARASITE EXAMINATION
CONCENTRATE RESULT:: NONE SEEN
MICRO NUMBER:: 853360
SPECIMEN QUALITY:: ADEQUATE
TRICHROME RESULT:: NONE SEEN

## 2019-02-01 LAB — FECAL LACTOFERRIN, QUANT
Fecal Lactoferrin: NEGATIVE
MICRO NUMBER:: 850331
SPECIMEN QUALITY:: ADEQUATE

## 2019-02-01 LAB — CLOSTRIDIUM DIFFICILE TOXIN B, QUALITATIVE, REAL-TIME PCR: Toxigenic C. Difficile by PCR: DETECTED — AB

## 2019-02-01 NOTE — Telephone Encounter (Signed)
I called the patient and he is ok with the medication change to Flagyl per PCP recommendation. He did not have any other questions. PCP sent in new medication.

## 2019-02-01 NOTE — Telephone Encounter (Signed)
I sent oral vancomycin for him to treat C-diff colitis. I looked up Good[facility] it's still pretty expensive. Is this something we need to do a PA for or is this a deductible issue? If he's stuck with whatever the deductible is, [facility] has cheapest at [facility] or [facility] for $106 for the full rx can we look into this?

## 2019-02-02 ENCOUNTER — Encounter: Payer: Self-pay | Admitting: Neurology

## 2019-02-02 ENCOUNTER — Ambulatory Visit (INDEPENDENT_AMBULATORY_CARE_PROVIDER_SITE_OTHER): Payer: BC Managed Care – PPO | Admitting: Neurology

## 2019-02-02 ENCOUNTER — Other Ambulatory Visit: Payer: Self-pay

## 2019-02-02 DIAGNOSIS — G4489 Other headache syndrome: Secondary | ICD-10-CM

## 2019-02-02 DIAGNOSIS — H814 Vertigo of central origin: Secondary | ICD-10-CM | POA: Diagnosis not present

## 2019-02-02 MED ORDER — METRONIDAZOLE 500 MG PO TABS: 500.0000 mg | ORAL_TABLET | Freq: Three times a day (TID) | ORAL | 0 refills | Status: AC

## 2019-02-02 MED ORDER — TOPIRAMATE 25 MG PO TABS: ORAL_TABLET | ORAL | 3 refills | Status: DC

## 2019-02-02 NOTE — Addendum Note (Signed)
Addended by: Maryla Morrow on: 02/02/2019 09:39 AM   Modules accepted: Orders

## 2019-02-02 NOTE — Progress Notes (Signed)
Reason for visit: Headache, vertigo  Referring physician: Dr. Josepha Pigg is a 38 y.o. male  History of present illness:  Shawn Richardson is a 38 year old left-handed white male with a history of hypertension that was noted in April or May 2020.  The patient initiated treatment at that time.  Beginning in March 2020, the patient began having headaches and vertigo sensations that have continued.  He reports that he may get headaches in the front of the head or on the top of the head and get vertigo associated with this unassociated with nausea or vomiting.  The patient reports no photophobia or phonophobia with the headaches and he denies any visual field changes.  He has not had any blackout episodes or any numbness or weakness of the face, arms, or legs.  He denies any family history of headache.  He does not drink a lot of caffeinated products throughout the day.  He does not take medications for the headache, he is able to function through the headache.  The headaches may last several hours, they do not last all day long.  He recently had a bout of C.diff colitis and is on antibiotics for this.  He is sent to this office for an evaluation.  A MRI of the brain was ordered and June 2020, but this was never done.  Past Medical History:  Diagnosis Date  . Headaches, cluster   . Hypertension   . Vertigo     History reviewed. No pertinent surgical history.  Family History  Problem Relation Age of Onset  . Lung disease Mother   . Heart Problems Mother   . Dementia Father   . Gout Father     Social history:  reports that he has never smoked. He has never used smokeless tobacco. He reports current alcohol use. He reports that he does not use drugs.  Medications:  Prior to Admission medications   Medication Sig Start Date End Date Taking? Authorizing Provider  aspirin-acetaminophen-caffeine (EXCEDRIN MIGRAINE) [phone] MG tablet Take 1-2 tablets by mouth every 8 (eight)  hours as needed for headache. 11/02/18   Emeterio Reeve, DO  hydrochlorothiazide (HYDRODIURIL) 12.5 MG tablet TAKE 1 TABLET(12.5 MG) BY MOUTH DAILY 11/28/18   Emeterio Reeve, DO  lisinopril (ZESTRIL) 10 MG tablet TAKE 1 TABLET(10 MG) BY MOUTH DAILY 11/28/18   Emeterio Reeve, DO  meloxicam (MOBIC) 7.5 MG tablet TAKE 1 TABLET(7.5 MG) BY MOUTH DAILY 01/19/19   Emeterio Reeve, DO  vancomycin (VANCOCIN) 125 MG capsule Take 1 capsule (125 mg total) by mouth 4 (four) times daily for 10 days. 01/31/19 02/10/19  Emeterio Reeve, DO     No Known Allergies  ROS:  Out of a complete 14 system review of symptoms, the patient complains only of the following symptoms, and all other reviewed systems are negative.  Headache Vertigo  Blood pressure 125/85, pulse 78, temperature (!) 97.5 F (36.4 C), temperature source Temporal, height 6' (1.829 m), weight 268 lb (121.6 kg).  Physical Exam  General: The patient is alert and cooperative at the time of the examination.  The patient is obese.  Eyes: Pupils are equal, round, and reactive to light. Discs are flat bilaterally.  Neck: The neck is supple, no carotid bruits are noted.  Respiratory: The respiratory examination is clear.  Cardiovascular: The cardiovascular examination reveals a regular rate and rhythm, no obvious murmurs or rubs are noted.  Skin: Extremities are without significant edema.  Neurologic Exam  Mental status:  The patient is alert and oriented x 3 at the time of the examination. The patient has apparent normal recent and remote memory, with an apparently normal attention span and concentration ability.  Cranial nerves: Facial symmetry is present. There is good sensation of the face to pinprick and soft touch bilaterally. The strength of the facial muscles and the muscles to head turning and shoulder shrug are normal bilaterally. Speech is well enunciated, no aphasia or dysarthria is noted. Extraocular movements are full.  Visual fields are full. The tongue is midline, and the patient has symmetric elevation of the soft palate. No obvious hearing deficits are noted.  Motor: The motor testing reveals 5 over 5 strength of all 4 extremities. Good symmetric motor tone is noted throughout.  Sensory: Sensory testing is intact to pinprick, soft touch, vibration sensation, and position sense on all 4 extremities. No evidence of extinction is noted.  Coordination: Cerebellar testing reveals good finger-nose-finger and heel-to-shin bilaterally.  Gait and station: Gait is normal. Tandem gait is normal. Romberg is negative. No drift is seen.  Reflexes: Deep tendon reflexes are symmetric and normal bilaterally. Toes are downgoing bilaterally.   Assessment/Plan:  1.  Headache, vertigo  The patient may have mild migraine headaches at this point.  We will start Topamax for this.  He claims he gets about 3 headaches a week on average.  He will call if he has any significant side effects on the medication.  He will otherwise follow-up in 3 months.  We will reorder the MRI of the brain.  Shawn Richardson. Keith Michael Walrath MD 02/02/2019 9:34 AM  [facility] [address] [place], [place] [id number]  Phone [phone] Fax [phone]

## 2019-02-02 NOTE — Patient Instructions (Signed)
We will start Topamax for the headache.   Topamax (topiramate) is a seizure medication that has an FDA approval for seizures and for migraine headache. Potential side effects of this medication include weight loss, cognitive slowing, tingling in the fingers and toes, and carbonated drinks will taste bad. If any significant side effects are noted on this drug, please contact our office.  

## 2019-02-03 LAB — GIARDIA/CRYPTOSPORIDIUM (EIA)
MICRO NUMBER:: 849316
MICRO NUMBER:: 849317
RESULT:: NOT DETECTED
RESULT:: NOT DETECTED
SPECIMEN QUALITY:: ADEQUATE
SPECIMEN QUALITY:: ADEQUATE

## 2019-02-05 DIAGNOSIS — I10 Essential (primary) hypertension: Secondary | ICD-10-CM | POA: Diagnosis not present

## 2019-02-05 DIAGNOSIS — R58 Hemorrhage, not elsewhere classified: Secondary | ICD-10-CM | POA: Diagnosis not present

## 2019-02-05 DIAGNOSIS — W19XXXA Unspecified fall, initial encounter: Secondary | ICD-10-CM | POA: Diagnosis not present

## 2019-02-05 DIAGNOSIS — S01511A Laceration without foreign body of lip, initial encounter: Secondary | ICD-10-CM | POA: Diagnosis not present

## 2019-02-05 DIAGNOSIS — S00532A Contusion of oral cavity, initial encounter: Secondary | ICD-10-CM | POA: Diagnosis not present

## 2019-02-05 DIAGNOSIS — R0902 Hypoxemia: Secondary | ICD-10-CM | POA: Diagnosis not present

## 2019-02-05 DIAGNOSIS — S0093XA Contusion of unspecified part of head, initial encounter: Secondary | ICD-10-CM | POA: Diagnosis not present

## 2019-02-07 ENCOUNTER — Telehealth: Payer: Self-pay | Admitting: Neurology

## 2019-02-07 NOTE — Telephone Encounter (Signed)
LVM for pt to call back about scheduling mri  [employer] Auth: [id number] (exp. 02/06/19 to 08/04/19)

## 2019-02-09 ENCOUNTER — Encounter: Payer: Self-pay | Admitting: Osteopathic Medicine

## 2019-02-09 ENCOUNTER — Other Ambulatory Visit: Payer: Self-pay

## 2019-02-09 ENCOUNTER — Ambulatory Visit (INDEPENDENT_AMBULATORY_CARE_PROVIDER_SITE_OTHER): Payer: BC Managed Care – PPO | Admitting: Osteopathic Medicine

## 2019-02-09 VITALS — BP 119/74 | HR 91 | Temp 98.2°F | Wt 271.4 lb

## 2019-02-09 DIAGNOSIS — S01511A Laceration without foreign body of lip, initial encounter: Secondary | ICD-10-CM

## 2019-02-09 NOTE — Progress Notes (Signed)
S:  Sutures placed in lip x3 simple interrupted, object fell onto patient's face at work.  Patient has no other complaints at this time   O: Laceration upper lower lip on R appears to be healting normally ER records reviewed  A/P: Laceration, healing normally  Skin cleaned w/ alcohol swabs, sutured removed without difficulty, patient tolerated the procedure well.   F/U as needed / routine care as directed

## 2019-04-24 NOTE — Progress Notes (Deleted)
PATIENT: Shawn Richardson DOB: 1980/08/07  REASON FOR VISIT: follow up HISTORY FROM: patient  HISTORY OF PRESENT ILLNESS: Today 04/24/19  Shawn Richardson is a 38 year old male with history of hypertension and headaches.  He began having headaches and vertigo in March 2020.  MRI of the brain was ordered but was not completed.  He was started on Topamax.  HISTORY 02/02/2019 Dr. Jannifer Franklin: Shawn Richardson is a 38 year old left-handed white male with a history of hypertension that was noted in April or May 2020.  The patient initiated treatment at that time.  Beginning in March 2020, the patient began having headaches and vertigo sensations that have continued.  He reports that he may get headaches in the front of the head or on the top of the head and get vertigo associated with this unassociated with nausea or vomiting.  The patient reports no photophobia or phonophobia with the headaches and he denies any visual field changes.  He has not had any blackout episodes or any numbness or weakness of the face, arms, or legs.  He denies any family history of headache.  He does not drink a lot of caffeinated products throughout the day.  He does not take medications for the headache, he is able to function through the headache.  The headaches may last several hours, they do not last all day long.  He recently had a bout of C.diff colitis and is on antibiotics for this.  He is sent to this office for an evaluation.  A MRI of the brain was ordered and June 2020, but this was never done.  REVIEW OF SYSTEMS: Out of a complete 14 system review of symptoms, the patient complains only of the following symptoms, and all other reviewed systems are negative.  ALLERGIES: No Known Allergies  HOME MEDICATIONS: Outpatient Medications Prior to Visit  Medication Sig Dispense Refill  . aspirin-acetaminophen-caffeine (EXCEDRIN MIGRAINE) 250-250-65 MG tablet Take 1-2 tablets by mouth every 8 (eight) hours as needed for headache. 30  tablet 0  . hydrochlorothiazide (HYDRODIURIL) 12.5 MG tablet TAKE 1 TABLET(12.5 MG) BY MOUTH DAILY 90 tablet 1  . lisinopril (ZESTRIL) 10 MG tablet TAKE 1 TABLET(10 MG) BY MOUTH DAILY 90 tablet 1  . meloxicam (MOBIC) 7.5 MG tablet TAKE 1 TABLET(7.5 MG) BY MOUTH DAILY 30 tablet 0  . topiramate (TOPAMAX) 25 MG tablet Take one tablet at night for one week, then take 2 tablets at night for one week, then take 3 tablets at night. 90 tablet 3   No facility-administered medications prior to visit.     PAST MEDICAL HISTORY: Past Medical History:  Diagnosis Date  . Headaches, cluster   . Hypertension   . Vertigo     PAST SURGICAL HISTORY: No past surgical history on file.  FAMILY HISTORY: Family History  Problem Relation Age of Onset  . Lung disease Mother   . Heart Problems Mother   . Dementia Father   . Gout Father     SOCIAL HISTORY: Social History   Socioeconomic History  . Marital status: Single    Spouse name: Not on file  . Number of children: Not on file  . Years of education: Not on file  . Highest education level: Not on file  Occupational History  . Not on file  Social Richardson  . Financial resource strain: Not on file  . Food insecurity    Worry: Not on file    Inability: Not on file  . Transportation Richardson  Medical: Not on file    Non-medical: Not on file  Tobacco Use  . Smoking status: Never Smoker  . Smokeless tobacco: Never Used  Substance and Sexual Activity  . Alcohol use: Yes  . Drug use: Never  . Sexual activity: Yes  Lifestyle  . Physical activity    Days per week: Not on file    Minutes per session: Not on file  . Stress: Not on file  Relationships  . Social Musician on phone: Not on file    Gets together: Not on file    Attends religious service: Not on file    Active member of club or organization: Not on file    Attends meetings of clubs or organizations: Not on file    Relationship status: Not on file  . Intimate partner  violence    Fear of current or ex partner: Not on file    Emotionally abused: Not on file    Physically abused: Not on file    Forced sexual activity: Not on file  Other Topics Concern  . Not on file  Social History Narrative   Left handed   Caffeine~ 1-2 cups per day   Lives at home with girlfriend       PHYSICAL EXAM  There were no vitals filed for this visit. There is no height or weight on file to calculate BMI.  Generalized: Well developed, in no acute distress   Neurological examination  Mentation: Alert oriented to time, place, history taking. Follows all commands speech and language fluent Cranial nerve II-XII: Pupils were equal round reactive to light. Extraocular movements were full, visual field were full on confrontational test. Facial sensation and strength were normal. Uvula tongue midline. Head turning and shoulder shrug  were normal and symmetric. Motor: The motor testing reveals 5 over 5 strength of all 4 extremities. Good symmetric motor tone is noted throughout.  Sensory: Sensory testing is intact to soft touch on all 4 extremities. No evidence of extinction is noted.  Coordination: Cerebellar testing reveals good finger-nose-finger and heel-to-shin bilaterally.  Gait and station: Gait is normal. Tandem gait is normal. Romberg is negative. No drift is seen.  Reflexes: Deep tendon reflexes are symmetric and normal bilaterally.   DIAGNOSTIC DATA (LABS, IMAGING, TESTING) - I reviewed patient records, labs, notes, testing and imaging myself where available.  Lab Results  Component Value Date   WBC 6.4 01/26/2019   HGB 13.0 (L) 01/26/2019   HCT 40.7 01/26/2019   MCV 92.1 01/26/2019   PLT 352 01/26/2019      Component Value Date/Time   NA 138 01/26/2019 1552   K 4.9 01/26/2019 1552   CL 104 01/26/2019 1552   CO2 28 01/26/2019 1552   GLUCOSE 116 (H) 01/26/2019 1552   BUN 14 01/26/2019 1552   CREATININE 1.26 01/26/2019 1552   CALCIUM 9.7 01/26/2019 1552    PROT 7.0 01/26/2019 1552   ALBUMIN 4.2 04/01/2009 1711   AST 23 01/26/2019 1552   ALT 38 01/26/2019 1552   ALKPHOS 55 04/01/2009 1711   BILITOT 0.6 01/26/2019 1552   GFRNONAA 72 01/26/2019 1552   GFRAA 83 01/26/2019 1552   No results found for: CHOL, HDL, LDLCALC, LDLDIRECT, TRIG, CHOLHDL No results found for: VOHY0V No results found for: VITAMINB12 No results found for: TSH    ASSESSMENT AND PLAN 38 y.o. year old male  has a past medical history of Headaches, cluster, Hypertension, and Vertigo. here with ***  I spent 15 minutes with the patient. 50% of this time was spent   Margie EgeSarah Toy Eisemann, [place]ville[place]NP-C, DNP 04/24/2019, 11:34 AM [facility] [address], [address] [place], [place] [address] [phone]

## 2019-04-25 ENCOUNTER — Encounter: Payer: Self-pay | Admitting: Neurology

## 2019-04-25 ENCOUNTER — Telehealth: Payer: Self-pay

## 2019-04-25 ENCOUNTER — Ambulatory Visit: Payer: BC Managed Care – PPO | Admitting: Neurology

## 2019-04-25 NOTE — Telephone Encounter (Signed)
Patient was a no call/no show for their appointment today.   

## 2019-05-31 ENCOUNTER — Ambulatory Visit: Payer: BC Managed Care – PPO | Admitting: Nurse Practitioner

## 2019-06-01 ENCOUNTER — Ambulatory Visit (INDEPENDENT_AMBULATORY_CARE_PROVIDER_SITE_OTHER): Payer: BC Managed Care – PPO

## 2019-06-01 ENCOUNTER — Ambulatory Visit (INDEPENDENT_AMBULATORY_CARE_PROVIDER_SITE_OTHER): Payer: BC Managed Care – PPO | Admitting: Nurse Practitioner

## 2019-06-01 ENCOUNTER — Other Ambulatory Visit: Payer: Self-pay

## 2019-06-01 ENCOUNTER — Encounter: Payer: Self-pay | Admitting: Nurse Practitioner

## 2019-06-01 VITALS — BP 101/68 | HR 73 | Temp 98.3°F | Ht 72.0 in | Wt 268.0 lb

## 2019-06-01 DIAGNOSIS — G542 Cervical root disorders, not elsewhere classified: Secondary | ICD-10-CM

## 2019-06-01 DIAGNOSIS — R0789 Other chest pain: Secondary | ICD-10-CM

## 2019-06-01 DIAGNOSIS — M25512 Pain in left shoulder: Secondary | ICD-10-CM | POA: Diagnosis not present

## 2019-06-01 DIAGNOSIS — G8929 Other chronic pain: Secondary | ICD-10-CM | POA: Diagnosis not present

## 2019-06-01 MED ORDER — MELOXICAM 15 MG PO TABS: ORAL_TABLET | ORAL | 3 refills | Status: DC

## 2019-06-01 MED ORDER — PREDNISONE 50 MG PO TABS: ORAL_TABLET | ORAL | 0 refills | Status: DC

## 2019-06-01 NOTE — Assessment & Plan Note (Addendum)
Several weeks of pain in the left upper chest, no real exacerbating or palliating factors. No progressive weakness, no trauma, no constitutional symptoms. Shoulder exam is completely unremarkable. Positive Spurling sign on the left with the neck exam. 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 Starting conservatively with prednisone, meloxicam, formal PT, x-rays. Return to see me in 4 to 6 weeks, MRI for interventional planning if no better.

## 2019-06-01 NOTE — Patient Instructions (Addendum)
You will receive a call to schedule physical therapy.   Go get the x-ray today.   Dr. Karie Schwalbe wrote a script for meloxicam (Mobic) and prednisone. Do not take the meloxicam until you have completed the 5 day of prednisone.

## 2019-06-01 NOTE — Progress Notes (Signed)
    Procedures performed today:    None.  Independent interpretation of tests performed by another provider:   None.  Impression and Recommendations:    Cervical plexus neuropathy Several weeks of pain in the left upper chest, no real exacerbating or palliating factors. No progressive weakness, no trauma, no constitutional symptoms. Shoulder exam is completely unremarkable. Positive Spurling sign on the left with the neck exam. 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 Starting conservatively with prednisone, meloxicam, formal PT, x-rays. Return to see me in 4 to 6 weeks, MRI for interventional planning if no better.    ___________________________________________ Ihor Austin. Benjamin Stain, M.D., ABFM., CAQSM. Primary Care and Sports Medicine [facility]  Adjunct Instructor of Family Medicine  [employer]

## 2019-06-01 NOTE — Progress Notes (Signed)
Acute Office Visit  Subjective:    Patient ID: Shawn Richardson, male    DOB: Apr 20, 1981, 39 y.o.   MRN: [id number]  Chief Complaint  Patient presents with  . Chest Wall pain    HPI Patient is in today for chest wall pain. He reports he has had this pain on and off for several years and was ultimately diagnosed with costochondritis. In July 2020 he started having the discomfort once again after having to pull and push equipment at work. He started taking meloxicam at that time, which significantly helped his pain. He has since ran out of meloxicam and is experiencing pain again.   He reports the pain as a "deep burning" pain located on the left chest wall just below the clavicle. He feels that using his arms at work (pushing and pulling) must be making it worse. Meloxicam makes it better. He has not tried any additional treatments.    Past Medical History:  Diagnosis Date  . Headaches, cluster   . Hypertension   . Vertigo     History reviewed. No pertinent surgical history.  Family History  Problem Relation Age of Onset  . Lung disease Mother   . Heart Problems Mother   . Dementia Father   . Gout Father     Social History   Socioeconomic History  . Marital status: Single    Spouse name: Not on file  . Number of children: Not on file  . Years of education: Not on file  . Highest education level: Not on file  Occupational History  . Not on file  Tobacco Use  . Smoking status: Never Smoker  . Smokeless tobacco: Never Used  Substance and Sexual Activity  . Alcohol use: Yes  . Drug use: Never  . Sexual activity: Yes  Other Topics Concern  . Not on file  Social History Narrative   Left handed   Caffeine~ 1-2 cups per day   Lives at home with girlfriend    Social Determinants of Health   Financial Resource Strain:   . Difficulty of Paying Living Expenses: Not on file  Food Insecurity:   . Worried About Programme researcher, broadcasting/film/video in the Last Year: Not on file  . Ran Out  of Food in the Last Year: Not on file  Transportation Needs:   . Lack of Transportation (Medical): Not on file  . Lack of Transportation (Non-Medical): Not on file  Physical Activity:   . Days of Exercise per Week: Not on file  . Minutes of Exercise per Session: Not on file  Stress:   . Feeling of Stress : Not on file  Social Connections:   . Frequency of Communication with Friends and Family: Not on file  . Frequency of Social Gatherings with Friends and Family: Not on file  . Attends Religious Services: Not on file  . Active Member of Clubs or Organizations: Not on file  . Attends Banker Meetings: Not on file  . Marital Status: Not on file  Intimate Partner Violence:   . Fear of Current or Ex-Partner: Not on file  . Emotionally Abused: Not on file  . Physically Abused: Not on file  . Sexually Abused: Not on file    Outpatient Medications Prior to Visit  Medication Sig Dispense Refill  . aspirin-acetaminophen-caffeine (EXCEDRIN MIGRAINE) 250-250-65 MG tablet Take 1-2 tablets by mouth every 8 (eight) hours as needed for headache. 30 tablet 0  . hydrochlorothiazide (HYDRODIURIL) 12.5 MG tablet  TAKE 1 TABLET(12.5 MG) BY MOUTH DAILY 90 tablet 1  . lisinopril (ZESTRIL) 10 MG tablet TAKE 1 TABLET(10 MG) BY MOUTH DAILY 90 tablet 1  . meloxicam (MOBIC) 7.5 MG tablet TAKE 1 TABLET(7.5 MG) BY MOUTH DAILY (Patient not taking: Reported on 06/01/2019) 30 tablet 0  . topiramate (TOPAMAX) 25 MG tablet Take one tablet at night for one week, then take 2 tablets at night for one week, then take 3 tablets at night. (Patient not taking: Reported on 06/01/2019) 90 tablet 3   No facility-administered medications prior to visit.    No Known Allergies  Review of Systems  Constitutional: Negative for activity change.  Respiratory: Negative for cough, chest tightness, shortness of breath and wheezing.   Cardiovascular: Positive for chest pain. Negative for palpitations.       Chest wall pain  on the left midclavicular line just below the clavicle.   Musculoskeletal: Negative for back pain, myalgias, neck pain and neck stiffness.  Neurological: Negative for tremors, weakness and numbness.       Objective:    Physical Exam Constitutional:      Appearance: Normal appearance.  Cardiovascular:     Rate and Rhythm: Normal rate and regular rhythm.     Pulses: Normal pulses.  Pulmonary:     Effort: Pulmonary effort is normal. No respiratory distress.     Breath sounds: Normal breath sounds.  Musculoskeletal:        General: Tenderness present. No swelling, deformity or signs of injury. Normal range of motion.     Cervical back: No rigidity or tenderness.  Skin:    General: Skin is warm and dry.  Neurological:     Mental Status: He is alert.     BP 101/68   Pulse 73   Temp 98.3 F (36.8 C) (Oral)   Ht 6' (1.829 m)   Wt 268 lb 0.6 oz (121.6 kg)   SpO2 97%   BMI 36.35 kg/m  Wt Readings from Last 3 Encounters:  06/01/19 268 lb 0.6 oz (121.6 kg)  02/09/19 271 lb 6.4 oz (123.1 kg)  02/02/19 268 lb (121.6 kg)    Health Maintenance Due  Topic Date Due  . HIV Screening  02/03/1995    There are no preventive care reminders to display for this patient.   No results found for: TSH Lab Results  Component Value Date   WBC 6.4 01/26/2019   HGB 13.0 (L) 01/26/2019   HCT 40.7 01/26/2019   MCV 92.1 01/26/2019   PLT 352 01/26/2019   Lab Results  Component Value Date   NA 138 01/26/2019   K 4.9 01/26/2019   CO2 28 01/26/2019   GLUCOSE 116 (H) 01/26/2019   BUN 14 01/26/2019   CREATININE 1.26 01/26/2019   BILITOT 0.6 01/26/2019   ALKPHOS 55 04/01/2009   AST 23 01/26/2019   ALT 38 01/26/2019   PROT 7.0 01/26/2019   ALBUMIN 4.2 04/01/2009   CALCIUM 9.7 01/26/2019   No results found for: CHOL No results found for: HDL No results found for: LDLCALC No results found for: TRIG No results found for: CHOLHDL No results found for: HGBA1C     Assessment & Plan:    Problem List Items Addressed This Visit    None       No orders of the defined types were placed in this encounter.    Orma Render, NP

## 2019-06-08 ENCOUNTER — Ambulatory Visit (INDEPENDENT_AMBULATORY_CARE_PROVIDER_SITE_OTHER): Payer: BC Managed Care – PPO | Admitting: Physical Therapy

## 2019-06-08 ENCOUNTER — Encounter: Payer: Self-pay | Admitting: Physical Therapy

## 2019-06-08 ENCOUNTER — Other Ambulatory Visit: Payer: Self-pay

## 2019-06-08 DIAGNOSIS — M62838 Other muscle spasm: Secondary | ICD-10-CM

## 2019-06-08 DIAGNOSIS — M6281 Muscle weakness (generalized): Secondary | ICD-10-CM | POA: Diagnosis not present

## 2019-06-08 DIAGNOSIS — R29898 Other symptoms and signs involving the musculoskeletal system: Secondary | ICD-10-CM | POA: Diagnosis not present

## 2019-06-08 NOTE — Therapy (Addendum)
[place], [place], [address] Phone: [phone]   Fax:  [phone]  Physical Therapy Evaluation  Patient Details  Name: Shawn Richardson MRN: [id number] Date of Birth: 1980-10-03 Referring Provider (PT): T. Thekkekandam   Encounter Date: 06/08/2019  PT End of Session - 06/08/19 1106    Visit Number  1    Number of Visits  12    Date for PT Re-Evaluation  07/20/19    Authorization Type  BCBS    PT Start Time  1106    PT Stop Time  1142    PT Time Calculation (min)  36 min    Activity Tolerance  Patient tolerated treatment well    Behavior During Therapy  WFL for tasks assessed/performed       Past Medical History:  Diagnosis Date  . Headaches, cluster   . Hypertension   . Vertigo     History reviewed. No pertinent surgical history.  There were no vitals filed for this visit.   Subjective Assessment - 06/08/19 1108    Subjective  Patient has been having left ant chest pain for about a year. 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 Pain is a burning sensation that lingers.    Pertinent History  Migraines, HTN    Diagnostic tests  xrays - neg    Patient Stated Goals  decrease pain    Currently in Pain?  Yes    Pain Score  2    up to 5-6/10   Pain Location  Chest    Pain Orientation  Left    Pain Descriptors / Indicators  Burning;Pressure    Pain Type  Chronic pain    Pain Onset  More than a month ago    Pain Frequency  Constant    Aggravating Factors   unsure    Pain Relieving Factors  meloxicam calms a little         [facility] PT Assessment - 06/08/19 0001      Assessment   Medical Diagnosis  cervical plexus neuropathy    Referring Provider (PT)  T. Thekkekandam    Onset Date/Surgical Date  05/25/18    Hand Dominance  Left    Next MD Visit  one month    Prior Therapy  no      Precautions   Precautions  None      Restrictions   Weight Bearing Restrictions  No      Balance Screen   Has the patient fallen in the past 6 months  No    Has the patient had a decrease in activity level because of a fear of falling?   No    Is the patient reluctant to leave their home because of a fear of falling?   No      Home Film/video editor residence      Prior Function   Level of Independence  Independent    Vocation  Full time employment    Vocation Requirements  pushing/pulling 5-10#      Posture/Postural Control   Posture/Postural Control  Postural limitations    Postural Limitations  Rounded Shoulders;Forward head    Posture Comments  tight left pectorals      ROM / Strength   AROM / PROM / Strength  AROM;Strength      AROM   Overall AROM Comments  Cerv flex/ext WNL,  Cervical right rot 56 deg, left rot 78 deg. Cervical Rt SB 22  Lt SB 34:  Left shoulder A/PROM IR 48/60 deg, else shoulder WNL. Cervical left rotation feels in chest, also with standing left shoulder IR:       Strength   Overall Strength Comments  left shoulder 4+/5; Pectorals 5/5 but pain with MMT      Palpation   Palpation comment  left pectorals and proximal SCM left  are tender with palpation                Objective measurements completed on examination: See above findings.              PT Education - 06/08/19 1306    Education Details  HEP    Person(s) Educated  Patient    Methods  Explanation;Demonstration;Handout    Comprehension  Verbalized understanding;Returned demonstration          PT Long Term Goals - 06/08/19 1313      PT LONG TERM GOAL #1   Title  Ind with HEP for strength and flexibilty    Time  6    Period  Weeks    Status  New    Target Date  07/20/19      PT LONG TERM GOAL #2   Title  Patient to report no pain in left chest with ADLS.    Time  6    Period  Weeks    Status  New      PT LONG TERM GOAL #3   Title  Patient to demonstrate right cervcial rotation and SB  without reports of tightness and pain in ant chest.    Time  6    Period  Weeks    Status  New      PT LONG TERM GOAL #4   Title  Pt to demo improved RUE strength to 5/5 ease ADLS and work requirements.    Time  6    Period  Weeks    Status  New      PT LONG TERM GOAL #5   Title  Pt to demo improved left shoulder ER to 65 degrees or more to normalize ADLs    Time  6    Period  Weeks    Status  New             Plan - 06/08/19 1306    Clinical Impression Statement  Patient presents today with left anterior chest pain x 1 year. He does a lot of pushing/pulling of trays at work and feels this contributed to pain. He has tightness in his pectorals and neck musculature limiting neck ROM. He also has tightness in his left shoulder limiting IR and weakness in the left shoulder throughout. Patient will benefit from PT to address these deficits and return him to his painfree PLOF.    Personal Factors and Comorbidities  Comorbidity 2    Comorbidities  migraines, HTN    Stability/Clinical Decision Making  Stable/Uncomplicated    Clinical Decision Making  Low    Rehab Potential  Excellent    PT Frequency  2x / week    PT Duration  6 weeks    PT Treatment/Interventions  ADLs/Self Care Home Management;Cryotherapy;Electrical Stimulation;Iontophoresis 4mg /ml Dexamethasone;Ultrasound;Therapeutic exercise;Neuromuscular re-education;Patient/family education;Manual techniques;Taping;Dry needling;Spinal Manipulations;Joint Manipulations;Traction     Plan next visit: shoulder strengthening, neck flexibility Patient agrees with plan of care.   Patient will benefit from skilled therapeutic intervention in order to improve  the following deficits and impairments:  Pain, Postural dysfunction, Impaired flexibility, Decreased strength  Visit Diagnosis: Other symptoms and signs involving the musculoskeletal system - Plan: PT plan of care cert/re-cert  Other muscle spasm - Plan: PT plan of care  cert/re-cert  Muscle weakness (generalized) - Plan: PT plan of care cert/re-cert     Problem List Patient Active Problem List   Diagnosis Date Noted  . 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 Headache syndrome 02/02/2019  . C. difficile diarrhea 01/31/2019    Solon Palm PT 06/08/2019, 1:34 PM  [facility] [address] [place], [place], [address] Phone: [phone]   Fax:  [phone]  Name: CELSO GRANJA MRN: [id number] Date of Birth: [birth year]

## 2019-06-13 NOTE — Patient Instructions (Signed)
Access Code: [email or web address]  URL: [email or web address]  Date: 06/13/2019  Prepared by: Solon Palm   Exercises Sleeper Stretch - 3 reps - 1 sets - 30-60 sec hold - 2x daily - 7x weekly Doorway Pec Stretch at 120 Degrees Abduction - 3 reps - 1 sets - 30-60 sec hold - 2x daily - 7x weekly Doorway Pec Stretch at 90 Degrees Abduction - 3 reps - 1 sets - 30-60 seconds hold - 1x daily - 7x weekly Corner Pec Minor Stretch - 3 reps - 1 sets - 30-60 sec hold - 1x daily - 7x weekly Seated Levator Scapulae Stretch - 3 reps - 30 hold - 2x daily - 7x weekly Seated Upper Trapezius Stretch - 3 reps - 30 hold - 2x daily - 7x weekly Sternocleidomastoid Stretch - 1 sets - 3 reps - 30 hold - 2x daily - 7x weekly

## 2019-06-14 ENCOUNTER — Encounter: Payer: BC Managed Care – PPO | Admitting: Physical Therapy

## 2019-06-21 ENCOUNTER — Ambulatory Visit (INDEPENDENT_AMBULATORY_CARE_PROVIDER_SITE_OTHER): Payer: BC Managed Care – PPO | Admitting: Physical Therapy

## 2019-06-21 ENCOUNTER — Other Ambulatory Visit: Payer: Self-pay

## 2019-06-21 DIAGNOSIS — M6281 Muscle weakness (generalized): Secondary | ICD-10-CM

## 2019-06-21 DIAGNOSIS — M62838 Other muscle spasm: Secondary | ICD-10-CM | POA: Diagnosis not present

## 2019-06-21 DIAGNOSIS — R29898 Other symptoms and signs involving the musculoskeletal system: Secondary | ICD-10-CM | POA: Diagnosis not present

## 2019-06-21 NOTE — Therapy (Signed)
[facility] [place] [address] [place], [place], [address] Phone: [phone]   Fax:  [phone]  Physical Therapy Treatment  Patient Details  Name: Shawn Richardson MRN: [id number] Date of Birth: Jun 24, 1980 Referring Provider (PT): T. Thekkekandam   Encounter Date: 06/21/2019  PT End of Session - 06/21/19 1152    Visit Number  2    Number of Visits  12    Date for PT Re-Evaluation  07/20/19    Authorization Type  BCBS    PT Start Time  1150    PT Stop Time  1230    PT Time Calculation (min)  40 min    Activity Tolerance  Patient tolerated treatment well    Behavior During Therapy  WFL for tasks assessed/performed       Past Medical History:  Diagnosis Date  . Headaches, cluster   . Hypertension   . Vertigo     No past surgical history on file.  There were no vitals filed for this visit.  Subjective Assessment - 06/21/19 1152    Subjective  Patient reporting just tightness with intermittent burning pain. He reports compliance with HEP.    Pertinent History  Migraines, HTN    Patient Stated Goals  decrease pain    Currently in Pain?  Yes    Pain Location  Chest    Pain Orientation  Left    Pain Descriptors / Indicators  Burning         [facility] PT Assessment - 06/21/19 0001      Assessment   Medical Diagnosis  cervical plexus neuropathy    Referring Provider (PT)  T. Thekkekandam    Onset Date/Surgical Date  05/25/18    Hand Dominance  Left    Next MD Visit  one month    Prior Therapy  no                   [facility] Adult PT Treatment/Exercise - 06/21/19 0001      Exercises   Exercises  Neck      Manual Therapy   Manual Therapy  Soft tissue mobilization    Manual therapy comments  skilled palpation and monitoring of soft tissue during DN      Soft tissue mobilization  IASTM to left SCM, scalenes, pectorals, corachobrachialis, deltoids and biceps      Neck Exercises: Stretches   Upper  Trapezius Stretch  Right;2 reps;20 seconds    Levator Stretch  Right;2 reps;20 seconds    Neck Stretch  2 reps;20 seconds   Lt SCM per HEP   Chest Stretch  20 seconds;3 reps    Chest Stretch Limitations  doorway at 3 levels        Trigger Point Dry Needling - 06/21/19 0001    Consent Given?  Yes    Education Handout Provided  Yes    Muscles Treated Head and Neck  Sternocleidomastoid    Muscles Treated Upper Quadrant  Pectoralis major;Pectoralis minor;Subscapularis;Biceps;Coracobrachialis    Dry Needling Comments  left    Sternocleidomastoid Response  Twitch response elicited;Palpable increased muscle length    Pectoralis Major Response  Palpable increased muscle length    Pectoralis Minor Response  Palpable increased muscle length    Subscapularis Response  Twitch response elicited    Biceps Response  Palpable increased muscle length    Coracobrachialis Response  Palpable increased muscle length;Twitch response elicited           PT Education -  06/21/19 1243    Education Details  DN education and aftercare    Person(s) Educated  Patient    Methods  Explanation;Demonstration;Handout    Comprehension  Verbalized understanding;Returned demonstration          PT Long Term Goals - 06/08/19 1313      PT LONG TERM GOAL #1   Title  Ind with HEP for strength and flexibilty    Time  6    Period  Weeks    Status  New    Target Date  07/20/19      PT LONG TERM GOAL #2   Title  Patient to report no pain in left chest with ADLS.    Time  6    Period  Weeks    Status  New      PT LONG TERM GOAL #3   Title  Patient to demonstrate right cervcial rotation and SB without reports of tightness and pain in ant chest.    Time  6    Period  Weeks    Status  New      PT LONG TERM GOAL #4   Title  Pt to demo improved RUE strength to 5/5 ease ADLS and work requirements.    Time  6    Period  Weeks    Status  New      PT LONG TERM GOAL #5   Title  Pt to demo improved left  shoulder ER to 65 degrees or more to normalize ADLs    Time  6    Period  Weeks    Status  New            Plan - 06/21/19 1244    Clinical Impression Statement  Patient presents with continued complaints of chest tightness. He responded very well to IASTM in the pectorals with palpable decrease in tension at end of treatment. Good response to DN as well. Goals are ongoing.    Comorbidities  migraines, HTN    PT Frequency  2x / week    PT Duration  6 weeks    PT Treatment/Interventions  ADLs/Self Care Home Management;Cryotherapy;Electrical Stimulation;Iontophoresis 4mg /ml Dexamethasone;Ultrasound;Therapeutic exercise;Neuromuscular re-education;Patient/family education;Manual techniques;Taping;Dry needling;Spinal Manipulations;Joint Manipulations;Traction    PT Next Visit Plan  Assss DN/STW; Left shoulder strengthening, neck flexibility    PT Home Exercise Plan  [email or web address]    Consulted and Agree with Plan of Care  Patient       Patient will benefit from skilled therapeutic intervention in order to improve the following deficits and impairments:  Pain, Postural dysfunction, Impaired flexibility, Decreased strength  Visit Diagnosis: Other muscle spasm  Other symptoms and signs involving the musculoskeletal system  Muscle weakness (generalized)     Problem List Patient Active Problem List   Diagnosis Date Noted  . 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 Headache syndrome 02/02/2019  . C. difficile diarrhea 01/31/2019    Madelyn Flavors PT 06/21/2019, 12:46 PM  [facility] [place], [place], [address] Phone: [phone]   Fax:  [phone]  Name: Shawn Richardson MRN: [id number] Date of Birth: 1980-06-18

## 2019-06-26 ENCOUNTER — Other Ambulatory Visit: Payer: Self-pay

## 2019-06-26 MED ORDER — LISINOPRIL 10 MG PO TABS: ORAL_TABLET | ORAL | 0 refills | Status: DC

## 2019-06-26 MED ORDER — HYDROCHLOROTHIAZIDE 12.5 MG PO TABS: ORAL_TABLET | ORAL | 0 refills | Status: DC

## 2019-06-28 ENCOUNTER — Other Ambulatory Visit: Payer: Self-pay

## 2019-06-28 ENCOUNTER — Encounter: Payer: Self-pay | Admitting: Rehabilitative and Restorative Service Providers"

## 2019-06-28 ENCOUNTER — Ambulatory Visit: Payer: BC Managed Care – PPO | Admitting: Physical Therapy

## 2019-06-28 DIAGNOSIS — R29898 Other symptoms and signs involving the musculoskeletal system: Secondary | ICD-10-CM | POA: Diagnosis not present

## 2019-06-28 DIAGNOSIS — M6281 Muscle weakness (generalized): Secondary | ICD-10-CM

## 2019-06-28 DIAGNOSIS — M62838 Other muscle spasm: Secondary | ICD-10-CM | POA: Diagnosis not present

## 2019-06-28 NOTE — Therapy (Signed)
[place], [place], [address] Phone: [phone]   Fax:  [phone]  Physical Therapy Treatment  Patient Details  Name: Shawn Richardson MRN: [id number] Date of Birth: 03/21/1981 Referring Provider (PT): T. Thekkekandam   Encounter Date: 06/28/2019  PT End of Session - 06/28/19 1203    Visit Number  3    Number of Visits  12    Date for PT Re-Evaluation  07/20/19    Authorization Type  BCBS    PT Start Time  1150    PT Stop Time  1230    PT Time Calculation (min)  40 min    Activity Tolerance  Patient tolerated treatment well    Behavior During Therapy  WFL for tasks assessed/performed       Past Medical History:  Diagnosis Date  . Headaches, cluster   . Hypertension   . Vertigo     History reviewed. No pertinent surgical history.  There were no vitals filed for this visit.  Subjective Assessment - 06/28/19 1153    Subjective  The patient reports constant, low grade pain in L anterior chest musculature that is 3/10.  Dry needling helped relax his muscles per report.    Pertinent History  Migraines, HTN    Diagnostic tests  xrays - neg    Patient Stated Goals  decrease pain    Currently in Pain?  Yes    Pain Score  3     Pain Location  Chest    Pain Orientation  Left    Pain Descriptors / Indicators  Aching;Discomfort    Pain Type  Chronic pain    Pain Onset  More than a month ago    Pain Frequency  Constant    Aggravating Factors   unsure    Pain Relieving Factors  meds help a little         [facility] PT Assessment - 06/28/19 1213      Assessment   Medical Diagnosis  cervical plexus neuropathy    Referring Provider (PT)  T. Thekkekandam    Onset Date/Surgical Date  05/25/18    Hand Dominance  Left    Next MD Visit  one month      AROM   Overall AROM Comments  AROM L shoulder ER is 80 degrees.  Neck ROM equal and symmetric bilaterally today without pain in chest.      Strength   Overall Strength Comments  L shoulder flexion/abduction 5/5 and IR/ER 5/5.                     [facility] Adult PT Treatment/Exercise - 06/28/19 1200      Exercises   Exercises  Neck;Shoulder      Neck Exercises: Theraband   Rows  10 reps;Red    Shoulder External Rotation  10 reps;Red    Shoulder External Rotation Limitations  patient rotates from body; needs cues to perform correctly; tried with arm in row position with     Shoulder Internal Rotation  10 reps;Red      Neck Exercises: Standing   Other Standing Exercises  standing neck AROM in all planes WNLs without pain.      Neck Exercises: Prone   Other Prone Exercise  prone on elbows with lateral rocking R<>L for scapular and thoracic mobility      Shoulder Exercises: Prone   Retraction  Strengthening;Right;10 reps    Retraction Limitations  prone row  Flexion  Strengthening;Right;Left;10 reps    Flexion Limitations  prone "superman" with alternating shoulder flexion      Shoulder Exercises: Standing   External Rotation  Strengthening;Left;5 reps    Theraband Level (Shoulder External Rotation)  Level 2 (Red)    Internal Rotation  Strengthening;Left;10 reps    Retraction  Strengthening;Both;12 reps    Theraband Level (Shoulder Retraction)  Level 2 (Red)    Other Standing Exercises  lat pulls x 10 with red band and correction on technique      Manual Therapy   Manual Therapy  Joint mobilization;Soft tissue mobilization;Neural Stretch    Manual therapy comments  supine positioning for muscle lengthening and pain reduction    Joint Mobilization  caudal glide shoulder (left) in abduction due to tightness noted with "superman" exercise grade II.    Soft tissue mobilization  IASTM and STM L pectoralis and biceps    Neural Stretch  supine 1/2 foam roll stretch adding wrist flexion/extension with full shoulder abduction for neural gliding             PT Education - 06/28/19 1229    Education Details   HEP progression adding resistance training    Person(s) Educated  Patient    Methods  Explanation;Demonstration;Handout    Comprehension  Verbalized understanding;Returned demonstration          PT Long Term Goals - 06/28/19 1227      PT LONG TERM GOAL #1   Title  Ind with HEP for strength and flexibilty    Time  6    Period  Weeks    Status  On-going      PT LONG TERM GOAL #2   Title  Patient to report no pain in left chest with ADLS.    Time  6    Period  Weeks    Status  On-going      PT LONG TERM GOAL #3   Title  Patient to demonstrate right cervcial rotation and SB without reports of tightness and pain in ant chest.    Baseline  met on 06/28/2019    Time  6    Period  Weeks    Status  Achieved      PT LONG TERM GOAL #4   Title  Pt to demo improved RUE strength to 5/5 ease ADLS and work requirements.    Baseline  met on 06/28/2019    Time  6    Period  Weeks    Status  Achieved      PT LONG TERM GOAL #5   Title  Pt to demo improved left shoulder ER to 65 degrees or more to normalize ADLs    Baseline  ER to 80 degrees in supine and standing AROM.  met on 06/28/2019    Time  6    Period  Weeks    Status  Achieved            Plan - 06/28/19 1317    Clinical Impression Statement  The patient has met 3 LTGs with improved shoulder and neck ROM and improved shoulder strength.  He continues with frequent discomfort L pectoralis region noting repetitive movements in work environment.  PT added resistance training to progress postural strengthening and plans to look at work simulation to help with mechanics to reduce overuse.    Comorbidities  migraines, HTN    PT Frequency  2x / week    PT Duration  6 weeks  PT Treatment/Interventions  ADLs/Self Care Home Management;Cryotherapy;Electrical Stimulation;Iontophoresis 7m/ml Dexamethasone;Ultrasound;Therapeutic exercise;Neuromuscular re-education;Patient/family education;Manual techniques;Taping;Dry needling;Spinal  Manipulations;Joint Manipulations;Traction    PT Next Visit Plan  Assss DN/STW; Left shoulder strengthening, neck flexibility, work simulation to understand repetitive use    PT Home Exercise Plan  [email or web address]    Consulted and Agree with Plan of Care  Patient       Patient will benefit from skilled therapeutic intervention in order to improve the following deficits and impairments:  Pain, Postural dysfunction, Impaired flexibility, Decreased strength  Visit Diagnosis: Muscle weakness (generalized)  Other muscle spasm  Other symptoms and signs involving the musculoskeletal system     Problem List Patient Active Problem List   Diagnosis Date Noted  . 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 Headache syndrome 02/02/2019  . C. difficile diarrhea 01/31/2019    Shawn Richardson , PT 06/28/2019, 1:18 PM  [facility] [address]Phone: [phone]  Fax:  [phone] Name: QKARLIN HEILMANMRN: [id number] of Birth: [birth year]

## 2019-06-28 NOTE — Patient Instructions (Signed)
Access Code: [email or web address]  URL: [email or web address]  Date: 06/28/2019  Prepared by: Margretta Ditty   Exercises Sleeper Stretch - 3 reps - 1 sets - 30-60 sec hold - 2x daily - 7x weekly Doorway Pec Stretch at 120 Degrees Abduction - 3 reps - 1 sets - 30-60 sec hold - 2x daily - 7x weekly Doorway Pec Stretch at 90 Degrees Abduction - 3 reps - 1 sets - 30-60 seconds hold - 1x daily - 7x weekly Corner Pec Minor Stretch - 3 reps - 1 sets - 30-60 sec hold - 1x daily - 7x weekly Seated Levator Scapulae Stretch - 3 reps - 30 hold - 2x daily - 7x weekly Seated Upper Trapezius Stretch - 3 reps - 30 hold - 2x daily - 7x weekly Sternocleidomastoid Stretch - 1 sets - 3 reps - 30 hold - 2x daily - 7x weekly Prone on Elbows Reach - 10 reps - 1 sets - 2x daily - 7x weekly Scapular Retraction with Resistance - 12 reps - 1 sets - 2x daily - 7x weekly Standing Lat Pull Down with Resistance - Elbows Bent - 12 reps - 1 sets - 2x daily - 7x weekly

## 2019-07-05 ENCOUNTER — Ambulatory Visit (INDEPENDENT_AMBULATORY_CARE_PROVIDER_SITE_OTHER): Payer: BC Managed Care – PPO | Admitting: Physical Therapy

## 2019-07-05 ENCOUNTER — Other Ambulatory Visit: Payer: Self-pay

## 2019-07-05 ENCOUNTER — Encounter: Payer: Self-pay | Admitting: Physical Therapy

## 2019-07-05 DIAGNOSIS — M62838 Other muscle spasm: Secondary | ICD-10-CM | POA: Diagnosis not present

## 2019-07-05 DIAGNOSIS — R29898 Other symptoms and signs involving the musculoskeletal system: Secondary | ICD-10-CM

## 2019-07-05 DIAGNOSIS — M6281 Muscle weakness (generalized): Secondary | ICD-10-CM | POA: Diagnosis not present

## 2019-07-05 NOTE — Patient Instructions (Signed)
Access Code: [email or web address]  URL: [email or web address]  Date: 07/05/2019  Prepared by: Solon Palm   Exercises Sleeper Stretch - 3 reps - 1 sets - 30-60 sec hold - 2x daily - 7x weekly Doorway Pec Stretch at 120 Degrees Abduction - 3 reps - 1 sets - 30-60 sec hold - 2x daily - 7x weekly Doorway Pec Stretch at 90 Degrees Abduction - 3 reps - 1 sets - 30-60 seconds hold - 1x daily - 7x weekly Corner Pec Minor Stretch - 3 reps - 1 sets - 30-60 sec hold - 1x daily - 7x weekly Seated Levator Scapulae Stretch - 3 reps - 30 hold - 2x daily - 7x weekly Seated Upper Trapezius Stretch - 3 reps - 30 hold - 2x daily - 7x weekly Sternocleidomastoid Stretch - 1 sets - 3 reps - 30 hold - 2x daily - 7x weekly Prone on Elbows Reach - 10 reps - 1 sets - 2x daily - 7x weekly Scapular Retraction with Resistance - 12 reps - 1 sets - 2x daily - 7x weekly Standing Lat Pull Down with Resistance - Elbows Bent - 12 reps - 1 sets - 2x daily - 7x weekly Standing Shoulder External Rotation with Resistance - 12 reps - 1 sets - 2x daily - 7x weekly

## 2019-07-05 NOTE — Therapy (Addendum)
[place], [place], [address] Phone: [phone]   Fax:  [phone]  Physical Therapy Treatment and Discharge Summary  Patient Details  Name: Shawn Richardson MRN: [id number] Date of Birth: October 31, 1980 Referring Provider (PT): T. Thekkekandam   Encounter Date: 07/05/2019  PT End of Session - 07/05/19 1148    Visit Number  4    Number of Visits  12    Date for PT Re-Evaluation  07/20/19    Authorization Type  BCBS    PT Start Time  1149    PT Stop Time  1240    PT Time Calculation (min)  51 min    Activity Tolerance  Patient tolerated treatment well    Behavior During Therapy  WFL for tasks assessed/performed       Past Medical History:  Diagnosis Date  . Headaches, cluster   . Hypertension   . Vertigo     History reviewed. No pertinent surgical history.  There were no vitals filed for this visit.  Subjective Assessment - 07/05/19 1149    Subjective  Patient states he is a whole lot looser and not getting that much pain at work.    Pain Score  2     Pain Location  Shoulder    Pain Orientation  Left    Pain Descriptors / Indicators  Aching    Pain Frequency  Intermittent                       [facility] Adult PT Treatment/Exercise - 07/05/19 0001      Neck Exercises: Theraband   Shoulder Extension  Red;20 reps    Rows  20 reps;Red    Shoulder External Rotation  15 reps;Red    Shoulder External Rotation Limitations  cues for correct form    Shoulder Internal Rotation  20 reps;Red    Shoulder Internal Rotation Limitations  cues for correct form    Horizontal ABduction  Red;20 reps      Neck Exercises: Standing   Other Standing Exercises  simulated grabbing and throwing trays with 1# wt no reproduction of symptoms.    Other Standing Exercises  simulated breaking down trays from overhead with yellow and red theraband. Pt did not feel with yellow, but did with red x 10       Shoulder Exercises: Stretch   Other Shoulder Stretches  doorway 90 deg x 20, 120 deg 2 x 30 ea      Modalities   Modalities  Moist Heat      Moist Heat Therapy   Number Minutes Moist Heat  10 Minutes    Moist Heat Location  Other (comment)   left chest and arm     Manual Therapy   Manual Therapy  Soft tissue mobilization    Manual therapy comments  skilled palpation and monitoring of soft tissue during DN      Soft tissue mobilization  IASTM and STM L pectoralis, ant/mid deltoid and biceps       Trigger Point Dry Needling - 07/05/19 0001    Consent Given?  Yes    Education Handout Provided  Previously provided    Muscles Treated Upper Quadrant  Pectoralis major;Pectoralis minor;Deltoid;Biceps;Coracobrachialis    Pectoralis Major Response  Palpable increased muscle length;Twitch response elicited    Pectoralis Minor Response  Twitch response elicited;Palpable increased muscle length    Deltoid Response  Twitch response elicited;Palpable increased muscle length  Biceps Response  Twitch response elicited;Palpable increased muscle length    Coracobrachialis Response  Twitch response elicited;Palpable increased muscle length           PT Education - 07/05/19 1231    Education Details  HEP progressed    Person(s) Educated  Patient    Methods  Explanation;Demonstration;Handout    Comprehension  Verbalized understanding;Returned demonstration          PT Long Term Goals - 07/05/19 1232      PT LONG TERM GOAL #1   Title  Ind with HEP for strength and flexibilty    Baseline  still requiring cues for proper form    Status  On-going      PT LONG TERM GOAL #2   Title  Patient to report no pain in left chest with ADLS.    Baseline  2/10    Status  On-going      PT LONG TERM GOAL #3   Title  Patient to demonstrate right cervcial rotation and SB without reports of tightness and pain in ant chest.    Baseline  met on 06/28/2019    Status  Achieved      PT LONG TERM  GOAL #4   Title  Pt to demo improved RUE strength to 5/5 ease ADLS and work requirements.    Status  Achieved      PT LONG TERM GOAL #5   Title  Pt to demo improved left shoulder ER to 65 degrees or more to normalize ADLs    Baseline  ER to 80 degrees in supine and standing AROM.  met on 06/28/2019    Status  Achieved            Plan - 07/05/19 1251    Clinical Impression Statement  Patient reports decreased pain overall. He fatigues with shoulder IR and ER exercises fairly easily. We could reproduce symptoms with work simulation of [place] reach and pulldown using red tband. Patient may benefit from further lower trap and lat strengthening. He requires VCs for correct form with theraband TE.    PT Treatment/Interventions  ADLs/Self Care Home Management;Cryotherapy;Electrical Stimulation;Iontophoresis 52m/ml Dexamethasone;Ultrasound;Therapeutic exercise;Neuromuscular re-education;Patient/family education;Manual techniques;Taping;Dry needling;Spinal Manipulations;Joint Manipulations;Traction    PT Next Visit Plan  Assss DN/STW; Left shoulder strengthening for IR, ER and OH work simulation. Monitor low traps and lat activities    Consulted and Agree with Plan of Care  Patient       Patient will benefit from skilled therapeutic intervention in order to improve the following deficits and impairments:  Pain, Postural dysfunction, Impaired flexibility, Decreased strength  Visit Diagnosis: Muscle weakness (generalized)  Other muscle spasm  Other symptoms and signs involving the musculoskeletal system     Problem List Patient Active Problem List   Diagnosis Date Noted  . 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 Headache syndrome 02/02/2019  . C. difficile diarrhea 01/31/2019    JMadelyn FlavorsPT 07/05/2019, 12:58 PM  [facility] [address]Phone: [phone]  Fax:  [phone] Name: Shawn BOZMANMRN: [id number] of Birth: 909-07-1980 PHYSICAL THERAPY DISCHARGE SUMMARY  Visits from Start of Care: 4  Current functional level related to goals / functional outcomes: Unable to assess at pt did not return   Remaining deficits: See above   Education / Equipment: HEP Plan: Patient agrees to discharge.  Patient goals were partially met. Patient is being discharged due to not  returning since the last visit.  ?????     Madelyn Flavors, PT 08/16/19 9:02 AM;  [facility] [place], [place] [address]  [phone] (office) [phone] (fax)

## 2019-11-23 ENCOUNTER — Encounter: Payer: Self-pay | Admitting: Osteopathic Medicine

## 2019-11-23 ENCOUNTER — Ambulatory Visit (INDEPENDENT_AMBULATORY_CARE_PROVIDER_SITE_OTHER): Payer: BC Managed Care – PPO | Admitting: Osteopathic Medicine

## 2019-11-23 VITALS — BP 140/87 | HR 77 | Temp 98.0°F | Wt 273.0 lb

## 2019-11-23 DIAGNOSIS — A0472 Enterocolitis due to Clostridium difficile, not specified as recurrent: Secondary | ICD-10-CM | POA: Diagnosis not present

## 2019-11-23 MED ORDER — VANCOMYCIN HCL 125 MG PO CAPS: 125.0000 mg | ORAL_CAPSULE | Freq: Four times a day (QID) | ORAL | 0 refills | Status: AC

## 2019-11-23 NOTE — Patient Instructions (Signed)
Plan: Will re-treat for C-diff  See printed Rx and coupon - should be $82 at [facility] If not better after treatment, or if worse/change, call me! May need referral to GI specialist if no better

## 2019-11-23 NOTE — Progress Notes (Signed)
Shawn Richardson is a 39 y.o. male who presents to  [facility]  today, 11/23/19, seeking care for the following:  . Previous tx for Cdiff, symptoms recurred about 2 weeks ago. No fever/abd pain, (+)diarrhea and bloody stool. Unclear on previous Rx but sounds like he wasn't able to afford vancomycin and alternative was Rx    ASSESSMENT & PLAN with other pertinent findings:  The encounter diagnosis was C. difficile colitis.    Patient Instructions  Plan: Will re-treat for C-diff  See printed Rx and coupon - should be $82 at [facility] If not better after treatment, or if worse/change, call me! May need referral to GI specialist if no better       No orders of the defined types were placed in this encounter.   Meds ordered this encounter  Medications  . vancomycin (VANCOCIN) 125 MG capsule    Sig: Take 1 capsule (125 mg total) by mouth 4 (four) times daily for 10 days.    Dispense:  40 capsule    Refill:  0       Follow-up instructions: Return if symptoms worsen or fail to improve.                                         BP 140/87 (BP Location: Left Arm, Patient Position: Sitting)   Pulse 77   Temp 98 F (36.7 C)   Wt 273 lb (123.8 kg)   SpO2 97%   BMI 37.03 kg/m   Current Meds  Medication Sig  . aspirin-acetaminophen-caffeine (EXCEDRIN MIGRAINE) 250-250-65 MG tablet Take 1-2 tablets by mouth every 8 (eight) hours as needed for headache.  . hydrochlorothiazide (HYDRODIURIL) 12.5 MG tablet TAKE 1 TABLET(12.5 MG) BY MOUTH DAILY  . lisinopril (ZESTRIL) 10 MG tablet TAKE 1 TABLET(10 MG) BY MOUTH DAILY  . meloxicam (MOBIC) 15 MG tablet One tab PO qAM with a meal for 2 weeks, then daily prn pain.  . predniSONE (DELTASONE) 50 MG tablet One tab PO daily for 5 days.    No results found for this or any previous visit (from the past 72 hour(s)).  No results  found.     All questions at time of visit were answered - patient instructed to contact office with any additional concerns or updates.  ER/RTC precautions were reviewed with the patient as applicable.   Please note: voice recognition software was used to produce this document, and typos may escape review. Please contact Dr. Lyn Hollingshead for any needed clarifications.

## 2019-12-04 ENCOUNTER — Ambulatory Visit (INDEPENDENT_AMBULATORY_CARE_PROVIDER_SITE_OTHER): Payer: BC Managed Care – PPO | Admitting: Physician Assistant

## 2019-12-04 DIAGNOSIS — Z5329 Procedure and treatment not carried out because of patient's decision for other reasons: Secondary | ICD-10-CM

## 2019-12-04 NOTE — Progress Notes (Signed)
No show

## 2019-12-06 ENCOUNTER — Ambulatory Visit (INDEPENDENT_AMBULATORY_CARE_PROVIDER_SITE_OTHER): Payer: BC Managed Care – PPO | Admitting: Physician Assistant

## 2019-12-06 ENCOUNTER — Encounter: Payer: Self-pay | Admitting: Physician Assistant

## 2019-12-06 VITALS — BP 125/84 | HR 69 | Temp 97.8°F | Ht 72.0 in | Wt 278.0 lb

## 2019-12-06 DIAGNOSIS — K921 Melena: Secondary | ICD-10-CM

## 2019-12-06 DIAGNOSIS — Z8619 Personal history of other infectious and parasitic diseases: Secondary | ICD-10-CM | POA: Diagnosis not present

## 2019-12-06 DIAGNOSIS — R197 Diarrhea, unspecified: Secondary | ICD-10-CM

## 2019-12-06 DIAGNOSIS — R1011 Right upper quadrant pain: Secondary | ICD-10-CM

## 2019-12-06 NOTE — Patient Instructions (Signed)
Will make GI referral.  Get labs and stool culture.  Will order abdominal ultrasound.

## 2019-12-06 NOTE — Progress Notes (Signed)
   Subjective:    Patient ID: Shawn Richardson, male    DOB: 1981/04/10, 39 y.o.   MRN: [id number]  HPI  Patient is a 39 year old obese male who presents to the clinic with ongoing diarrhea and new right upper quadrant pain for the last 3 weeks.  Patient has a history of C. difficile and was recently seen in the office on 11/23/2019 by Dr. Lyn Hollingshead and treated empirically with vancomycin for C. difficile.  He was not tested with stool culture at that time.  He continues to endorse black tarry stools but that his right upper quadrant pain is different from when he had C. difficile.  He denies any nausea or vomiting.  He denies any acid reflux. Food does not make better or worse. He feels more pressure than pain rates it 5/10. Hurts to sleep on abdomen. Warm compresses do feel better. No radiation of pain. No urinary symptoms. He does endorse some weight gain.    .. Active Ambulatory Problems    Diagnosis Date Noted  . C. difficile diarrhea 01/31/2019  . Headache syndrome 02/02/2019  . 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 No Resolved Ambulatory Problems   Past Medical History:  Diagnosis Date  . Headaches, cluster   . Hypertension   . Vertigo      Review of Systems See HPI.     Objective:   Physical Exam Vitals reviewed.  Constitutional:      Appearance: He is well-developed. He is obese.  Cardiovascular:     Rate and Rhythm: Normal rate.  Pulmonary:     Effort: Pulmonary effort is normal.  Abdominal:     General: Bowel sounds are normal. There is distension.     Palpations: There is hepatomegaly.     Tenderness: There is abdominal tenderness in the right upper quadrant and epigastric area. There is no right CVA tenderness, left CVA tenderness, guarding or rebound. Negative signs include Murphy's sign, Rovsing's sign, McBurney's sign and psoas sign.     Hernia: No hernia is present.  Neurological:     General: No focal  deficit present.     Mental Status: He is alert.  Psychiatric:        Mood and Affect: Mood normal.           Assessment & Plan:  Marland KitchenMarland KitchenTynan was seen today for abdominal pain.  Diagnoses and all orders for this visit:  Diarrhea, unspecified type -     Clostridium difficile culture-fecal -     Ambulatory referral to Gastroenterology -     US Abdomen Complete  RUQ pain -     COMPLETE METABOLIC PANEL WITH GFR -     Lipase -     CBC with Differential/Platelet -     Ambulatory referral to Gastroenterology -     US Abdomen Complete  Black tarry stools -     Ambulatory referral to Gastroenterology  History of Clostridioides difficile colitis -     Ambulatory referral to Gastroenterology   Just finished vancomycin treatment. Test for C.diff clearance.  RUQ pain sounds like more pressure perhaps from weight gain or liver enlargement. Liver did palpate to be a little large on exam today. Will check liver enzymes. Symptoms not consistent with gallbladder or gastritis.  Will get u/s of abdomen.  Will get labs. Continues to have black tarry stools but no bright red blood. Needs GI referral.

## 2019-12-07 ENCOUNTER — Other Ambulatory Visit: Payer: Self-pay | Admitting: Physician Assistant

## 2019-12-07 DIAGNOSIS — N179 Acute kidney failure, unspecified: Secondary | ICD-10-CM

## 2019-12-07 LAB — COMPLETE METABOLIC PANEL WITH GFR
AG Ratio: 1.5 (calc) (ref 1.0–2.5)
ALT: 57 U/L — ABNORMAL HIGH (ref 9–46)
AST: 36 U/L (ref 10–40)
Albumin: 4.3 g/dL (ref 3.6–5.1)
Alkaline phosphatase (APISO): 64 U/L (ref 36–130)
BUN/Creatinine Ratio: 14 (calc) (ref 6–22)
BUN: 20 mg/dL (ref 7–25)
CO2: 25 mmol/L (ref 20–32)
Calcium: 9.2 mg/dL (ref 8.6–10.3)
Chloride: 107 mmol/L (ref 98–110)
Creat: 1.48 mg/dL — ABNORMAL HIGH (ref 0.60–1.35)
GFR, Est African American: 68 mL/min/{1.73_m2} (ref >=60)
GFR, Est Non African American: 59 mL/min/{1.73_m2} — ABNORMAL LOW (ref >=60)
Globulin: 2.9 g/dL (calc) (ref 1.9–3.7)
Glucose, Bld: 115 mg/dL — ABNORMAL HIGH (ref 65–99)
Potassium: 4.5 mmol/L (ref 3.5–5.3)
Sodium: 140 mmol/L (ref 135–146)
Total Bilirubin: 0.3 mg/dL (ref 0.2–1.2)
Total Protein: 7.2 g/dL (ref 6.1–8.1)

## 2019-12-07 LAB — CBC WITH DIFFERENTIAL/PLATELET
Absolute Monocytes: 649 cells/uL (ref 200–950)
Basophils Absolute: 21 cells/uL (ref 0–200)
Basophils Relative: 0.3 %
Eosinophils Absolute: 21 cells/uL (ref 15–500)
Eosinophils Relative: 0.3 %
HCT: 40.3 % (ref 38.5–50.0)
Hemoglobin: 13 g/dL — ABNORMAL LOW (ref 13.2–17.1)
Lymphs Abs: 2305 cells/uL (ref 850–3900)
MCH: 30 pg (ref 27.0–33.0)
MCHC: 32.3 g/dL (ref 32.0–36.0)
MCV: 93.1 fL (ref 80.0–100.0)
MPV: 10.5 fL (ref 7.5–12.5)
Monocytes Relative: 9.4 %
Neutro Abs: 3905 cells/uL (ref 1500–7800)
Neutrophils Relative %: 56.6 %
Platelets: 279 10*3/uL (ref 140–400)
RBC: 4.33 10*6/uL (ref 4.20–5.80)
RDW: 11.3 % (ref 11.0–15.0)
Total Lymphocyte: 33.4 %
WBC: 6.9 10*3/uL (ref 3.8–10.8)

## 2019-12-07 LAB — LIPASE: Lipase: 37 U/L (ref 7–60)

## 2019-12-07 NOTE — Progress Notes (Signed)
Shawn Richardson,   Your kidney function is dropping from the last 10 months. I am also going to add renal U/S.

## 2019-12-08 DIAGNOSIS — R1011 Right upper quadrant pain: Secondary | ICD-10-CM | POA: Diagnosis not present

## 2019-12-08 DIAGNOSIS — R197 Diarrhea, unspecified: Secondary | ICD-10-CM | POA: Diagnosis not present

## 2019-12-11 ENCOUNTER — Ambulatory Visit: Payer: BC Managed Care – PPO | Admitting: Nurse Practitioner

## 2019-12-12 ENCOUNTER — Encounter: Payer: Self-pay | Admitting: Osteopathic Medicine

## 2019-12-14 LAB — CLOSTRIDIUM DIFFICILE CULTURE-FECAL

## 2019-12-15 NOTE — Progress Notes (Signed)
No C.diff. hopefully you have seen or have an appt with GI soon.

## 2019-12-21 ENCOUNTER — Ambulatory Visit (HOSPITAL_BASED_OUTPATIENT_CLINIC_OR_DEPARTMENT_OTHER): Admission: RE | Admit: 2019-12-21 | Payer: BC Managed Care – PPO | Source: Ambulatory Visit

## 2019-12-21 ENCOUNTER — Ambulatory Visit (HOSPITAL_BASED_OUTPATIENT_CLINIC_OR_DEPARTMENT_OTHER)
Admission: RE | Admit: 2019-12-21 | Discharge: 2019-12-21 | Disposition: A | Discharge status: Home / Self Care | Payer: BC Managed Care – PPO | Source: Ambulatory Visit | Attending: Physician Assistant | Admitting: Physician Assistant

## 2019-12-21 ENCOUNTER — Other Ambulatory Visit: Payer: Self-pay

## 2019-12-21 DIAGNOSIS — R1011 Right upper quadrant pain: Secondary | ICD-10-CM | POA: Diagnosis not present

## 2019-12-21 DIAGNOSIS — R197 Diarrhea, unspecified: Secondary | ICD-10-CM | POA: Insufficient documentation

## 2019-12-25 ENCOUNTER — Encounter: Payer: Self-pay | Admitting: Physician Assistant

## 2019-12-25 DIAGNOSIS — K760 Fatty (change of) liver, not elsewhere classified: Secondary | ICD-10-CM | POA: Insufficient documentation

## 2019-12-25 NOTE — Progress Notes (Signed)
Owen,   No significant findings on ultrasound of abdomen. Your liver does have some fatty accumulation. Avoiding alcohol and weight loss can help decrease that.

## 2020-01-02 ENCOUNTER — Other Ambulatory Visit: Payer: Self-pay

## 2020-01-02 MED ORDER — HYDROCHLOROTHIAZIDE 12.5 MG PO TABS: ORAL_TABLET | ORAL | 0 refills | Status: DC

## 2020-01-02 MED ORDER — LISINOPRIL 10 MG PO TABS: ORAL_TABLET | ORAL | 0 refills | Status: DC

## 2020-01-04 DIAGNOSIS — K2960 Other gastritis without bleeding: Secondary | ICD-10-CM | POA: Diagnosis not present

## 2020-01-04 DIAGNOSIS — K625 Hemorrhage of anus and rectum: Secondary | ICD-10-CM | POA: Diagnosis not present

## 2020-01-04 DIAGNOSIS — K921 Melena: Secondary | ICD-10-CM | POA: Diagnosis not present

## 2020-01-04 DIAGNOSIS — R197 Diarrhea, unspecified: Secondary | ICD-10-CM | POA: Diagnosis not present

## 2020-01-30 ENCOUNTER — Other Ambulatory Visit: Payer: Self-pay

## 2020-01-30 ENCOUNTER — Encounter: Payer: Self-pay | Admitting: Osteopathic Medicine

## 2020-01-30 ENCOUNTER — Ambulatory Visit (INDEPENDENT_AMBULATORY_CARE_PROVIDER_SITE_OTHER): Payer: BC Managed Care – PPO | Admitting: Osteopathic Medicine

## 2020-01-30 VITALS — BP 128/87 | HR 74 | Wt 268.0 lb

## 2020-01-30 DIAGNOSIS — N5082 Scrotal pain: Secondary | ICD-10-CM

## 2020-01-30 NOTE — Patient Instructions (Signed)
PLAN: Scrotum pain can have several causes. We are getting ultrasound today to make sure testicular anatomy is ok. We are checking today for sexually transmitted infections and urinary infections. We may consider antibiotic treatment for epididymitis or prostatitis if all the other labs/imaging check out ok. If worse, especially if fever, rash, unusual penile discharge, or severe pain, please seek emergency medical care as needed!

## 2020-01-30 NOTE — Progress Notes (Signed)
Shawn Richardson is a 39 y.o. male who presents to  [facility] at [facility]  today, 01/30/20, seeking care for the following:  . Scrotum pain - L and R, ongoing about a month, constant, feels like a sharp/tight pain, no urinary frequency or dysuria, no hematuria, no blood/discharge from penis, no blood or other unusual character to ejaculate, denies STI concerns, no fever or abdominal pain, no pain with defecation or sitting. On exam, no mass/tenderness, no fluctuance or cyst appreciated. Normal inguinal area no lymphadenopathy      ASSESSMENT & PLAN with other pertinent findings:  The encounter diagnosis was Scrotum pain. Ddx: epididymitis or prostatitis seems unlikely but would treat pending labs/imaging; anatomic issue such as cyst seems possible but I cannot appreciate on exam; torsion seems unlikely given he's had relatively constant symptoms for 1 month. Would have low threshold for urology referral for second opinion.    No results found for this or any previous visit (from the past 24 hour(s)).   Patient Instructions  PLAN: Scrotum pain can have several causes. We are getting ultrasound today to make sure testicular anatomy is ok. We are checking today for sexually transmitted infections and urinary infections. We may consider antibiotic treatment for epididymitis or prostatitis if all the other labs/imaging check out ok. If worse, especially if fever, rash, unusual penile discharge, or severe pain, please seek emergency medical care as needed!      Orders Placed This Encounter  Procedures  . C. trachomatis/N. gonorrhoeae RNA  . Urine Culture  . US Scrotum    No orders of the defined types were placed in this encounter.      Follow-up instructions: Return if symptoms worsen or fail to improve.                                         BP 128/87 (BP Location: Left Arm, Patient Position:  Sitting)   Pulse 74   Wt 268 lb (121.6 kg)   SpO2 97%   BMI 36.35 kg/m   Current Meds  Medication Sig  . aspirin-acetaminophen-caffeine (EXCEDRIN MIGRAINE) 250-250-65 MG tablet Take 1-2 tablets by mouth every 8 (eight) hours as needed for headache.  . hydrochlorothiazide (HYDRODIURIL) 12.5 MG tablet TAKE 1 TABLET(12.5 MG) BY MOUTH DAILY  . lisinopril (ZESTRIL) 10 MG tablet TAKE 1 TABLET(10 MG) BY MOUTH DAILY  . meloxicam (MOBIC) 15 MG tablet One tab PO qAM with a meal for 2 weeks, then daily prn pain.  Marland Kitchen topiramate (TOPAMAX) 25 MG tablet Take one tablet at night for one week, then take 2 tablets at night for one week, then take 3 tablets at night.    No results found for this or any previous visit (from the past 72 hour(s)).  No results found.     All questions at time of visit were answered - patient instructed to contact office with any additional concerns or updates.  ER/RTC precautions were reviewed with the patient as applicable.   Please note: voice recognition software was used to produce this document, and typos may escape review. Please contact Dr. Lyn Hollingshead for any needed clarifications.

## 2020-01-31 ENCOUNTER — Other Ambulatory Visit: Payer: BC Managed Care – PPO

## 2020-02-05 ENCOUNTER — Other Ambulatory Visit: Payer: Self-pay

## 2020-02-05 ENCOUNTER — Ambulatory Visit (INDEPENDENT_AMBULATORY_CARE_PROVIDER_SITE_OTHER): Payer: BC Managed Care – PPO

## 2020-02-05 DIAGNOSIS — N433 Hydrocele, unspecified: Secondary | ICD-10-CM | POA: Diagnosis not present

## 2020-02-05 DIAGNOSIS — N5082 Scrotal pain: Secondary | ICD-10-CM | POA: Diagnosis not present

## 2020-02-05 DIAGNOSIS — I861 Scrotal varices: Secondary | ICD-10-CM | POA: Diagnosis not present

## 2020-02-27 ENCOUNTER — Encounter: Payer: Self-pay | Admitting: Osteopathic Medicine

## 2020-02-27 ENCOUNTER — Ambulatory Visit (INDEPENDENT_AMBULATORY_CARE_PROVIDER_SITE_OTHER): Payer: BC Managed Care – PPO | Admitting: Osteopathic Medicine

## 2020-02-27 ENCOUNTER — Other Ambulatory Visit: Payer: Self-pay

## 2020-02-27 VITALS — BP 119/77 | HR 79 | Wt 271.0 lb

## 2020-02-27 DIAGNOSIS — N529 Male erectile dysfunction, unspecified: Secondary | ICD-10-CM

## 2020-02-27 DIAGNOSIS — N5082 Scrotal pain: Secondary | ICD-10-CM | POA: Diagnosis not present

## 2020-02-27 MED ORDER — SILDENAFIL CITRATE 100 MG PO TABS: 50.0000 mg | ORAL_TABLET | Freq: Every day | ORAL | 11 refills | Status: DC | PRN

## 2020-02-27 NOTE — Progress Notes (Signed)
Shawn Richardson is a 39 y.o. male who presents to  [facility] at [facility]  today, 02/27/20, seeking care for the following:  . Still having problems with scrotal pain - improved when he was in [place] for his 39th birthday, he believes maybe less stressed? Also c/o ED issues, fatigue.      ASSESSMENT & PLAN with other pertinent findings:  The primary encounter diagnosis was Scrotum pain. A diagnosis of Erectile dysfunction, unspecified erectile dysfunction type was also pertinent to this visit.   No results found for this or any previous visit (from the past 24 hour(s)).  BP Readings from Last 3 Encounters:  02/27/20 119/77  01/30/20 128/87  12/06/19 125/84     Patient Instructions  Labs on Friday AM 7:00 am downstairs  Rx sent to pharmacy If symptoms persist/worsen, or depending on labs/ response to Rx, may consider urology second opinion      Orders Placed This Encounter  Procedures  . CBC  . Testosterone  . COMPLETE METABOLIC PANEL WITH GFR  . TSH    Meds ordered this encounter  Medications  . sildenafil (VIAGRA) 100 MG tablet    Sig: Take 0.5-1 tablets (50-100 mg total) by mouth daily as needed for erectile dysfunction.    Dispense:  10 tablet    Refill:  11       Follow-up instructions: Return for RECHECK PENDING RESULTS / IF WORSE OR CHANGE.                                         BP 119/77 (BP Location: Left Arm, Patient Position: Sitting)   Pulse 79   Wt 271 lb (122.9 kg)   SpO2 98%   BMI 36.75 kg/m   Current Meds  Medication Sig  . aspirin-acetaminophen-caffeine (EXCEDRIN MIGRAINE) 250-250-65 MG tablet Take 1-2 tablets by mouth every 8 (eight) hours as needed for headache.  . hydrochlorothiazide (HYDRODIURIL) 12.5 MG tablet TAKE 1 TABLET(12.5 MG) BY MOUTH DAILY  . lisinopril (ZESTRIL) 10 MG tablet TAKE 1 TABLET(10 MG) BY MOUTH DAILY  . meloxicam (MOBIC) 15 MG  tablet One tab PO qAM with a meal for 2 weeks, then daily prn pain.  Marland Kitchen topiramate (TOPAMAX) 25 MG tablet Take one tablet at night for one week, then take 2 tablets at night for one week, then take 3 tablets at night.    No results found for this or any previous visit (from the past 72 hour(s)).  No results found.     All questions at time of visit were answered - patient instructed to contact office with any additional concerns or updates.  ER/RTC precautions were reviewed with the patient as applicable.   Please note: voice recognition software was used to produce this document, and typos may escape review. Please contact Dr. Lyn Hollingshead for any needed clarifications.

## 2020-02-27 NOTE — Patient Instructions (Signed)
Labs on Friday AM 7:00 am downstairs  Rx sent to pharmacy If symptoms persist/worsen, or depending on labs/ response to Rx, may consider urology second opinion

## 2020-02-29 ENCOUNTER — Encounter: Payer: Self-pay | Admitting: Osteopathic Medicine

## 2020-05-04 DIAGNOSIS — Z20822 Contact with and (suspected) exposure to covid-19: Secondary | ICD-10-CM | POA: Diagnosis not present

## 2020-05-04 DIAGNOSIS — Z03818 Encounter for observation for suspected exposure to other biological agents ruled out: Secondary | ICD-10-CM | POA: Diagnosis not present

## 2020-05-23 ENCOUNTER — Other Ambulatory Visit: Payer: Self-pay

## 2020-05-23 ENCOUNTER — Ambulatory Visit (INDEPENDENT_AMBULATORY_CARE_PROVIDER_SITE_OTHER): Payer: BC Managed Care – PPO | Admitting: Sports Medicine

## 2020-05-23 DIAGNOSIS — M6788 Other specified disorders of synovium and tendon, other site: Secondary | ICD-10-CM | POA: Diagnosis not present

## 2020-05-23 MED ORDER — NITROGLYCERIN 0.2 MG/HR TD PT24: MEDICATED_PATCH | TRANSDERMAL | 11 refills | Status: DC

## 2020-05-23 NOTE — Progress Notes (Signed)
    Procedures performed today:    None.  Independent interpretation of notes and tests performed by another provider:   None.  Brief History, Exam, Impression, and Recommendations:    Achilles tendinosis of right lower extremity This is a pleasant 39 year old male, he works in a factory, he has started to have pain in his right Achilles, mid tendon. Negative Thompson's test. No pain at the retrocalcaneal or the calcaneal bursae. We will start the standard treatment of topical nitroglycerin one quarter patch applied every 24 hours, eccentric rehabilitation with physical therapy and heel lifts. Return to see me in 6 weeks, we will likely go up to half patch of nitroglycerin daily if not sufficiently better, he understands the chronic nature of this process. We will need to discontinue sildenafil while he is using the topical nitroglycerin.    ___________________________________________ Ihor Austin. Benjamin Stain, M.D., ABFM., CAQSM. Primary Care and Sports Medicine [facility]  Adjunct Instructor of Family Medicine  [employer]

## 2020-05-23 NOTE — Assessment & Plan Note (Signed)
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 Negative Thompson's test. No pain at the retrocalcaneal or the calcaneal bursae. We will start the standard treatment of topical nitroglycerin one quarter patch applied every 24 hours, eccentric rehabilitation with physical therapy and heel lifts. Return to see me in 6 weeks, we will likely go up to half patch of nitroglycerin daily if not sufficiently better, he understands the chronic nature of this process. We will need to discontinue sildenafil while he is using the topical nitroglycerin.

## 2020-06-07 ENCOUNTER — Telehealth (INDEPENDENT_AMBULATORY_CARE_PROVIDER_SITE_OTHER): Payer: BC Managed Care – PPO | Admitting: Nurse Practitioner

## 2020-06-07 ENCOUNTER — Encounter: Payer: Self-pay | Admitting: Nurse Practitioner

## 2020-06-07 ENCOUNTER — Telehealth: Payer: Self-pay | Admitting: Nurse Practitioner

## 2020-06-07 DIAGNOSIS — J111 Influenza due to unidentified influenza virus with other respiratory manifestations: Secondary | ICD-10-CM | POA: Diagnosis not present

## 2020-06-07 DIAGNOSIS — Z20822 Contact with and (suspected) exposure to covid-19: Secondary | ICD-10-CM

## 2020-06-07 MED ORDER — OSELTAMIVIR PHOSPHATE 75 MG PO CAPS: 75.0000 mg | ORAL_CAPSULE | Freq: Two times a day (BID) | ORAL | 0 refills | Status: DC

## 2020-06-07 NOTE — Telephone Encounter (Signed)
Positive COVID test and presumed positive flu with close exposure. 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

## 2020-06-07 NOTE — Progress Notes (Signed)
Virtual Video Visit via MyChart Note  I connected with  Shawn Richardson on 06/07/20 at  9:30 AM EST by the video enabled telemedicine application for , MyChart, and verified that I am speaking with the correct person using two identifiers.   I introduced myself as a Publishing rights manager with the practice. We discussed the limitations of evaluation and management by telemedicine and the availability of in person appointments. The patient expressed understanding and agreed to proceed.  Participating parties in this visit include: The patient and the nurse practitioner listed.  The patient is: At home I am: In the office  Subjective:    CC:  Chief Complaint  Patient presents with  . Influenza  . Covid Exposure    HPI: Shawn Richardson is a 40 y.o. year old male presenting today via MyChart today for flu-like symptoms for the last 3 days. He tells me that his girlfriend has recently tested positive for the flu and he started developing symptoms. He is also concerned about possible COVID infection due to exposures and would like to be tested for this.    He endorses body aches, congestion, chills, fatigue, cough, and headache.  He denies shortness of breath, loss of taste or smell, chest pain, or wheezing.    He has been taking some cold medicine for symptoms which has been somewhat helpful.   Past medical history, Surgical history, Family history not pertinant except as noted below, Social history, Allergies, and medications have been entered into the medical record, reviewed, and corrections made.   Review of Systems:  All review of systems negative except what is listed in the HPI   Objective:    General:  Speaking clearly in complete sentences. Absent shortness of breath noted.   Alert and oriented x3.   Normal judgment.  Absent acute distress. He is audibly congested.   Impression and Recommendations:    1. Influenza - oseltamivir (TAMIFLU) 75 MG capsule; Take 1 capsule (75  mg total) by mouth 2 (two) times daily.  Dispense: 10 capsule; Refill: 0  2. Encounter by telehealth for suspected COVID-19  Given known exposure to the flu and length of time symptoms have been present, will send in for Tamiflu.  He has an appointment for COVID testing today.  Given known exposure and symptoms, no flu testing recommended at this time.   Recommend OTC medications that may be helpful for symptom management and to monitor for worsening symptoms.  Discussed the length of time for both illnesses and recommended quarantine.  Work note provided with quarantine details listed.   Follow-up if symptoms worsen or fail to improve.    I discussed the assessment and treatment plan with the patient. The patient was provided an opportunity to ask questions and all were answered. The patient agreed with the plan and demonstrated an understanding of the instructions.   The patient was advised to call back or seek an in-person evaluation if the symptoms worsen or if the condition fails to improve as anticipated.  I provided 20 minutes of non-face-to-face interaction with this MYCHART visit including intake, same-day documentation, and chart review.   Tollie Eth, NP

## 2020-06-07 NOTE — Patient Instructions (Addendum)
Mucinex (guiafenesin) and Flonase (flonase) can be helpful for symptoms of cough and congestion.      Influenza, Adult Influenza, also called "the flu," is a viral infection that mainly affects the respiratory tract. This includes the lungs, nose, and throat. The flu spreads easily from person to person (is contagious). It causes common cold symptoms, along with high fever and body aches. What are the causes? This condition is caused by the influenza virus. You can get the virus by:  Breathing in droplets that are in the air from an infected person's cough or sneeze.  Touching something that has the virus on it (has been contaminated) and then touching your mouth, nose, or eyes. What increases the risk? The following factors may make you more likely to get the flu:  Not washing or sanitizing your hands often.  Having close contact with many people during cold and flu season.  Touching your mouth, eyes, or nose without first washing or sanitizing your hands.  Not getting an annual flu shot. You may have a higher risk for the flu, including serious problems, such as a lung infection (pneumonia), if you:  Are older than 65.  Are pregnant.  Have a weakened disease-fighting system (immune system). This includes people who have HIV or AIDS, are on chemotherapy, or are taking medicines that reduce (suppress) the immune system.  Have a long-term (chronic) illness, such as heart disease, kidney disease, diabetes, or lung disease.  Have a liver disorder.  Are severely overweight (morbidly obese).  Have anemia.  Have asthma. What are the signs or symptoms? Symptoms of this condition usually begin suddenly and last 4-14 days. These may include:  Fever and chills.  Headaches, body aches, or muscle aches.  Sore throat.  Cough.  Runny or stuffy (congested) nose.  Chest discomfort.  Poor appetite.  Weakness or fatigue.  Dizziness.  Nausea or vomiting. How is this  diagnosed? This condition may be diagnosed based on:  Your symptoms and medical history.  A physical exam.  Swabbing your nose or throat and testing the fluid for the influenza virus. How is this treated? If the flu is diagnosed Shawn Richardson, you can be treated with antiviral medicine that is given by mouth (orally) or through an IV. This can help reduce how severe the illness is and how long it lasts. Taking care of yourself at home can help relieve symptoms. Your health care provider may recommend:  Taking over-the-counter medicines.  Drinking plenty of fluids. In many cases, the flu goes away on its own. If you have severe symptoms or complications, you may be treated in a hospital. Follow these instructions at home: Activity  Rest as needed and get plenty of sleep.  Stay home from work or school as told by your health care provider. Unless you are visiting your health care provider, avoid leaving home until your fever has been gone for 24 hours without taking medicine. Eating and drinking  Take an oral rehydration solution (ORS). This is a drink that is sold at pharmacies and retail stores.  Drink enough fluid to keep your urine pale yellow.  Drink clear fluids in small amounts as you are able. Clear fluids include water, ice chips, fruit juice mixed with water, and low-calorie sports drinks.  Eat bland, easy-to-digest foods in small amounts as you are able. These foods include bananas, applesauce, rice, lean meats, toast, and crackers.  Avoid drinking fluids that contain a lot of sugar or caffeine, such as energy drinks, regular  sports drinks, and soda.  Avoid alcohol.  Avoid spicy or fatty foods. General instructions  Take over-the-counter and prescription medicines only as told by your health care provider.  Use a cool mist humidifier to add humidity to the air in your home. This can make it easier to breathe. ? When using a cool mist humidifier, clean it daily. Empty the  water and replace it with clean water.  Cover your mouth and nose when you cough or sneeze.  Wash your hands with soap and water often and for at least 20 seconds, especially after you cough or sneeze. If soap and water are not available, use alcohol-based hand sanitizer.  Keep all follow-up visits. This is important.      How is this prevented?  Get an annual flu shot. This is usually available in late summer, fall, or winter. Ask your health care provider when you should get your flu shot.  Avoid contact with people who are sick during cold and flu season. This is generally fall and winter.   Contact a health care provider if:  You develop new symptoms.  You have: ? Chest pain. ? Diarrhea. ? A fever.  Your cough gets worse.  You produce more mucus.  You feel nauseous or you vomit. Get help right away if you:  Develop shortness of breath or have difficulty breathing.  Have skin or nails that turn a bluish color.  Have severe pain or stiffness in your neck.  Develop a sudden headache or sudden pain in your face or ear.  Cannot eat or drink without vomiting. These symptoms may represent a serious problem that is an emergency. Do not wait to see if the symptoms will go away. Get medical help right away. Call your local emergency services (911 in the [place] Do not drive yourself to the hospital. Summary  Influenza, also called "the flu," is a viral infection that primarily affects your respiratory tract.  Symptoms of the flu usually begin suddenly and last 4-14 days.  Getting an annual flu shot is the best way to prevent getting the flu.  Stay home from work or school as told by your health care provider. Unless you are visiting your health care provider, avoid leaving home until your fever has been gone for 24 hours without taking medicine.  Keep all follow-up visits. This is important. This information is not intended to replace advice given to you by your health  care provider. Make sure you discuss any questions you have with your health care provider. Document Revised: 12/29/2019 Document Reviewed: 12/29/2019 Elsevier Patient Education  2021 [employer].

## 2020-07-19 ENCOUNTER — Encounter: Payer: Self-pay | Admitting: Emergency Medicine

## 2020-07-19 ENCOUNTER — Emergency Department (INDEPENDENT_AMBULATORY_CARE_PROVIDER_SITE_OTHER)
Admission: EM | Admit: 2020-07-19 | Discharge: 2020-07-19 | Disposition: A | Discharge status: Home / Self Care | Payer: BC Managed Care – PPO | Source: Home / Self Care | Attending: Internal Medicine | Admitting: Internal Medicine

## 2020-07-19 ENCOUNTER — Other Ambulatory Visit: Payer: Self-pay

## 2020-07-19 DIAGNOSIS — S9032XA Contusion of left foot, initial encounter: Secondary | ICD-10-CM | POA: Diagnosis not present

## 2020-07-19 DIAGNOSIS — I10 Essential (primary) hypertension: Secondary | ICD-10-CM | POA: Diagnosis not present

## 2020-07-19 MED ORDER — HYDROCHLOROTHIAZIDE 25 MG PO TABS: 25.0000 mg | ORAL_TABLET | Freq: Every day | ORAL | 2 refills | Status: DC

## 2020-07-19 MED ORDER — IBUPROFEN 600 MG PO TABS: 600.0000 mg | ORAL_TABLET | Freq: Four times a day (QID) | ORAL | 0 refills | Status: DC | PRN

## 2020-07-19 NOTE — ED Provider Notes (Signed)
[facility]    CSN: [id number] Arrival date & time: 07/19/20  1217      History   Chief Complaint Chief Complaint  Patient presents with  . Foot Injury    left    HPI Shawn Richardson is a 40 y.o. male comes to the urgent care with complaints of left heel pain which started 3 days ago after he stepped on a drawer knob.  Pain is described as throbbing, currently 3 out of 10, aggravated by stepping on the heel or palpation.  He has been icing the heel with partial relief.  He is concerned about a fracture and wanted evaluation.   Patient's blood pressure is elevated.  He was previously on 2 antihypertensive medications which was discontinued because his blood pressure was controlled.  Since then his blood pressure has been elevated.  He denies any headaches, chest pain or abdominal pain.  He would like to be started on blood pressure medications again his blood pressure seems to be creeping up.  HPI  Past Medical History:  Diagnosis Date  . Headaches, cluster   . Hypertension   . Vertigo     Patient Active Problem List   Diagnosis Date Noted  . Achilles tendinosis of right lower extremity 05/23/2020  . Hepatic steatosis 12/25/2019  . 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 Headache syndrome 02/02/2019  . C. difficile diarrhea 01/31/2019    History reviewed. No pertinent surgical history.     Home Medications    Prior to Admission medications   Medication Sig Start Date End Date Taking? Authorizing Provider  hydrochlorothiazide (HYDRODIURIL) 25 MG tablet Take 1 tablet (25 mg total) by mouth daily. 07/19/20  Yes Jasia Hiltunen, Britta Mccreedy, MD  ibuprofen (ADVIL) 600 MG tablet Take 1 tablet (600 mg total) by mouth every 6 (six) hours as needed. 07/19/20  Yes Eulan Heyward, Britta Mccreedy, MD  nitroGLYCERIN (NITRODUR - DOSED IN MG/24 HR) 0.2 mg/hr patch Cut and apply 1/4 patch to most painful area q24h. 05/23/20   Monica Becton, MD  lisinopril (ZESTRIL) 10 MG tablet TAKE 1  TABLET(10 MG) BY MOUTH DAILY Patient not taking: Reported on 07/19/2020 01/02/20 07/19/20  Sunnie Nielsen, DO  topiramate (TOPAMAX) 25 MG tablet Take one tablet at night for one week, then take 2 tablets at night for one week, then take 3 tablets at night. Patient not taking: Reported on 07/19/2020 02/02/19 07/19/20  York Spaniel, MD    Family History Family History  Problem Relation Age of Onset  . Lung disease Mother   . Heart Problems Mother   . Dementia Father   . Gout Father     Social History Social History   Tobacco Use  . Smoking status: Never Smoker  . Smokeless tobacco: Never Used  Vaping Use  . Vaping Use: Never used  Substance Use Topics  . Alcohol use: Yes    Alcohol/week: 1.0 standard drink    Types: 1 Standard drinks or equivalent per week  . Drug use: Never     Allergies   Patient has no known allergies.   Review of Systems Review of Systems  Constitutional: Negative.   Respiratory: Negative.   Gastrointestinal: Negative.   Genitourinary: Negative.   Musculoskeletal: Positive for arthralgias. Negative for joint swelling and myalgias.  Neurological: Negative.      Physical Exam Triage Vital Signs ED Triage Vitals  Enc Vitals Group     BP 07/19/20 1230 (!) 148/103     Pulse Rate  07/19/20 1230 75     Resp 07/19/20 1230 17     Temp 07/19/20 1230 98.5 F (36.9 C)     Temp Source 07/19/20 1230 Oral     SpO2 07/19/20 1230 99 %     Weight 07/19/20 1232 276 lb (125.2 kg)     Height 07/19/20 1232 6' (1.829 m)     Head Circumference --      Peak Flow --      Pain Score 07/19/20 1232 3     Pain Loc --      Pain Edu? --      Excl. in GC? --    No data found.  Updated Vital Signs BP (!) 148/103 (BP Location: Right Arm) Comment: stopped BP meds 3 months per pcp  Pulse 75   Temp 98.5 F (36.9 C) (Oral)   Resp 17   Ht 6' (1.829 m)   Wt 125.2 kg   SpO2 99%   BMI 37.43 kg/m   Visual Acuity Right Eye Distance:   Left Eye Distance:    Bilateral Distance:    Right Eye Near:   Left Eye Near:    Bilateral Near:     Physical Exam Vitals and nursing note reviewed.  Constitutional:      General: He is not in acute distress.    Appearance: He is not ill-appearing.  Cardiovascular:     Rate and Rhythm: Normal rate and regular rhythm.     Pulses: Normal pulses.     Heart sounds: Normal heart sounds.  Pulmonary:     Effort: Pulmonary effort is normal.     Breath sounds: Normal breath sounds.  Musculoskeletal:        General: Tenderness present. No swelling, deformity or signs of injury. Normal range of motion.  Skin:    General: Skin is warm.     Findings: Bruising present.  Neurological:     Mental Status: He is alert.      UC Treatments / Results  Labs (all labs ordered are listed, but only abnormal results are displayed) Labs Reviewed - No data to display  EKG   Radiology No results found.  Procedures Procedures (including critical care time)  Medications Ordered in UC Medications - No data to display  Initial Impression / Assessment and Plan / UC Course  I have reviewed the triage vital signs and the nursing notes.  Pertinent labs & imaging results that were available during my care of the patient were reviewed by me and considered in my medical decision making (see chart for details).     1.  Contusion of the left foot: No indication for x-rays Patient is advised to continue taking ibuprofen as needed for pain Continue icing the heel.  2.  Uncontrolled hypertension: Restart hydrochlorothiazide 25 mg orally daily Follow-up with primary care physician Monitor blood pressure at home Return precautions given. Final Clinical Impressions(s) / UC Diagnoses   Final diagnoses:  Contusion of left foot, initial encounter  Uncontrolled hypertension     Discharge Instructions     Medications as directed Gentle range of motion exercises If you notice worsening pain in the left heel you or  bruising please return to urgent care to be reevaluated. Check your blood pressure periodically at home.   ED Prescriptions    Medication Sig Dispense Auth. Provider   hydrochlorothiazide (HYDRODIURIL) 25 MG tablet Take 1 tablet (25 mg total) by mouth daily. 30 tablet Haedyn Ancrum, Britta Mccreedy, MD   ibuprofen (  ADVIL) 600 MG tablet Take 1 tablet (600 mg total) by mouth every 6 (six) hours as needed. 30 tablet Sladen Plancarte, Britta Mccreedy, MD     PDMP not reviewed this encounter.   Merrilee Jansky, MD 07/19/20 [phone]

## 2020-07-19 NOTE — ED Triage Notes (Signed)
Stepped on a drawer knob while moving  2 days ago with his left heel Pain to left heel w/ small bruised area No puncture per pt  No OTC meds  BP elevated - recently stopped HTN meds  COVID vaccine 6/21 - no booster

## 2020-07-19 NOTE — Discharge Instructions (Signed)
Medications as directed Gentle range of motion exercises If you notice worsening pain in the left heel you or bruising please return to urgent care to be reevaluated. Check your blood pressure periodically at home.

## 2021-02-17 ENCOUNTER — Other Ambulatory Visit: Payer: Self-pay

## 2021-02-17 ENCOUNTER — Encounter: Payer: Self-pay | Admitting: Emergency Medicine

## 2021-02-17 ENCOUNTER — Emergency Department
Admission: EM | Admit: 2021-02-17 | Discharge: 2021-02-17 | Disposition: A | Discharge status: Home / Self Care | Payer: BC Managed Care – PPO | Source: Home / Self Care

## 2021-02-17 DIAGNOSIS — J029 Acute pharyngitis, unspecified: Secondary | ICD-10-CM

## 2021-02-17 LAB — POCT RAPID STREP A (OFFICE): Rapid Strep A Screen: NEGATIVE

## 2021-02-17 NOTE — Discharge Instructions (Signed)
Strep test is negative That means a sore throat is likely a virus Viral sore throat are treated conservatively with Tylenol or ibuprofen, salt water gargles, sore throat sprays, or sipping on warm tea I have sent a culture to the laboratory.  You will be called if it is positive Expect improvement over the next few days

## 2021-02-17 NOTE — ED Provider Notes (Signed)
[facility]    CSN: [id number] Arrival date & time: 02/17/21  1430      History   Chief Complaint Chief Complaint  Patient presents with   Sore Throat    Pt c/o sore throat and cough x4 days. States he took a at home covid test yesterday and it was negative.     HPI Shawn Richardson is a 40 y.o. male.   HPI  Sore throat for 4 days.  No fever or chills.  No headache or body aches.  Coughing and some postnasal drip.  COVID test at home is negative.  No known exposure to illness, strep, COVID.  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 difficile diarrhea 01/31/2019    History reviewed. No pertinent surgical history.     Home Medications    Prior to Admission medications   Medication Sig Start Date End Date Taking? Authorizing Provider  tadalafil (CIALIS) 10 MG tablet Take 10 mg by mouth daily as needed for erectile dysfunction.   Yes [provider]  ibuprofen (ADVIL) 600 MG tablet Take 1 tablet (600 mg total) by mouth every 6 (six) hours as needed. 07/19/20   Merrilee Jansky, MD  lisinopril (ZESTRIL) 10 MG tablet TAKE 1 TABLET(10 MG) BY MOUTH DAILY Patient not taking: Reported on 07/19/2020 01/02/20 07/19/20  Sunnie Nielsen, DO  topiramate (TOPAMAX) 25 MG tablet Take one tablet at night for one week, then take 2 tablets at night for one week, then take 3 tablets at night. Patient not taking: Reported on 07/19/2020 02/02/19 07/19/20  York Spaniel, MD    Family History Family History  Problem Relation Age of Onset   Lung disease Mother    Heart Problems Mother    Dementia Father    Gout Father     Social History Social History   Tobacco Use    Smoking status: Some Days    Types: Cigarettes   Smokeless tobacco: Never  Vaping Use   Vaping Use: Never used  Substance Use Topics   Alcohol use: Yes    Alcohol/week: 1.0 standard drink    Types: 1 Standard drinks or equivalent per week   Drug use: Never     Allergies   Patient has no known allergies.   Review of Systems Review of Systems See HPI  Physical Exam Triage Vital Signs ED Triage Vitals  Enc Vitals Group     BP 02/17/21 1445 133/90     Pulse Rate 02/17/21 1445 96     Resp --      Temp 02/17/21 1445 98.8 F (37.1 C)     Temp Source 02/17/21 1445 Oral     SpO2 02/17/21 1445 96 %     Weight --      Height --      Head Circumference --      Peak Flow --      Pain Score 02/17/21 1446 5     Pain Loc --      Pain Edu? --      Excl. in GC? --    No data found.  Updated Vital Signs BP 133/90 (BP Location:  Right Arm)   Pulse 96   Temp 98.8 F (37.1 C) (Oral)   SpO2 96%      Physical Exam Constitutional:      General: He is not in acute distress.    Appearance: Normal appearance. He is well-developed.  HENT:     Head: Normocephalic and atraumatic.     Right Ear: Tympanic membrane, ear canal and external ear normal.     Left Ear: Tympanic membrane, ear canal and external ear normal.     Nose: No congestion or rhinorrhea.     Mouth/Throat:     Mouth: Mucous membranes are moist.     Pharynx: Posterior oropharyngeal erythema present.     Comments: Mild redness of posterior pharynx.  No tonsillar swelling or exudate. Eyes:     Conjunctiva/sclera: Conjunctivae normal.     Pupils: Pupils are equal, round, and reactive to light.  Cardiovascular:     Rate and Rhythm: Normal rate and regular rhythm.     Heart sounds: Normal heart sounds.  Pulmonary:     Effort: Pulmonary effort is normal. No respiratory distress.     Breath sounds: Normal breath sounds. No wheezing or rales.  Abdominal:     General: There is no distension.     Palpations: Abdomen is  soft.  Musculoskeletal:        General: Normal range of motion.     Cervical back: Normal range of motion and neck supple.  Skin:    General: Skin is warm and dry.  Neurological:     Mental Status: He is alert.  Psychiatric:        Mood and Affect: Mood normal.        Behavior: Behavior normal.     UC Treatments / Results  Labs (all labs ordered are listed, but only abnormal results are displayed) Labs Reviewed  POCT RAPID STREP A (OFFICE)    EKG   Radiology No results found.  Procedures Procedures (including critical care time)  Medications Ordered in UC Medications - No data to display  Initial Impression / Assessment and Plan / UC Course  I have reviewed the triage vital signs and the nursing notes.  Pertinent labs & imaging results that were available during my care of the patient were reviewed by me and considered in my medical decision making (see chart for details).     Rapid strep is negative.  Throat culture is pending. Viral sore throat discussed Final Clinical Impressions(s) / UC Diagnoses   Final diagnoses:  None     Discharge Instructions      Strep test is negative That means a sore throat is likely a virus Viral sore throat are treated conservatively with Tylenol or ibuprofen, salt water gargles, sore throat sprays, or sipping on warm tea I have sent a culture to the laboratory.  You will be called if it is positive Expect improvement over the next few days   ED Prescriptions   None    PDMP not reviewed this encounter.   Eustace Moore, MD 02/17/21 [phone]

## 2021-02-17 NOTE — ED Triage Notes (Signed)
Pt c/o cough and sore throat x4 days. Denies fever. States he had a negative covid test at home x2 days ago.

## 2021-02-20 LAB — CULTURE, GROUP A STREP: Strep A Culture: NEGATIVE

## 2021-03-03 ENCOUNTER — Ambulatory Visit (INDEPENDENT_AMBULATORY_CARE_PROVIDER_SITE_OTHER): Payer: BC Managed Care – PPO | Admitting: Family Medicine

## 2021-03-03 ENCOUNTER — Encounter: Payer: Self-pay | Admitting: Family Medicine

## 2021-03-03 DIAGNOSIS — J029 Acute pharyngitis, unspecified: Secondary | ICD-10-CM | POA: Diagnosis not present

## 2021-03-03 NOTE — Patient Instructions (Signed)
Try adding cetirizine and flonase daily  Warm salt water gargles as needed.  Stay well hydrated.  Try adding home humidifier.  Let me know if not improving after about 2 weeks of consistent use.

## 2021-03-03 NOTE — Assessment & Plan Note (Signed)
Continued pharyngitis is likely related to postnasal drainage.  I recommend adding cetirizine and Flonase daily.  Warm salt water gargles recommended.  Also recommend that he add a humidifier at home as he is using the heat in his house now.

## 2021-03-03 NOTE — Progress Notes (Signed)
Shawn Richardson - 40 y.o. male MRN [id number]  Date of birth: January 29, 1981  Subjective Chief Complaint  Patient presents with   Sore Throat    HPI Shawn Richardson is a 40 year old male here today for urgent care follow-up.  He was seen for sore throat, nasal congestion and cough approximately 3 weeks ago.  He had negative strep test and throat culture.  Feel this is likely viral in nature.  He is concerned because symptoms have persisted.  He does continue to have some mild nasal congestion and postnasal drainage.  Cough has improved but does have occasionally.  He does continue to have sore throat at times.  He denies chest congestion, fever, chills, body aches, nausea, vomiting, diarrhea or reflux symptoms.  ROS:  A comprehensive ROS was completed and negative except as noted per HPI  No Known Allergies  Past Medical History:  Diagnosis Date   Headaches, cluster    Hypertension    Vertigo     No past surgical history on file.  Social History   Socioeconomic History   Marital status: Single    Spouse name: Not on file   Number of children: Not on file   Years of education: Not on file   Highest education level: Not on file  Occupational History   Not on file  Tobacco Use   Smoking status: Some Days    Types: Cigarettes   Smokeless tobacco: Never  Vaping Use   Vaping Use: Never used  Substance and Sexual Activity   Alcohol use: Yes    Alcohol/week: 1.0 standard drink    Types: 1 Standard drinks or equivalent per week   Drug use: Never   Sexual activity: Yes  Other Topics Concern   Not on file  Social History Narrative   Left handed   Caffeine~ 1-2 cups per day   Lives at home with girlfriend    Social Determinants of Health   Financial Resource Strain: Not on file  Food Insecurity: Not on file  Transportation Needs: Not on file  Physical Activity: Not on file  Stress: Not on file  Social Connections: Not on file    Family History  Problem Relation Age of Onset    Lung disease Mother    Heart Problems Mother    Dementia Father    Gout Father     Health Maintenance  Topic Date Due   HIV Screening  Never done   Hepatitis C Screening  Never done   COVID-19 Vaccine (3 - Booster for Moderna series) 05/17/2020   INFLUENZA VACCINE  08/22/2021 (Originally 12/23/2020)   TETANUS/TDAP  02/04/2029   HPV VACCINES  Aged Out     ----------------------------------------------------------------------------------------------------------------------------------------------------------------------------------------------------------------- Physical Exam BP 135/88 (BP Location: Left Arm, Patient Position: Sitting, Cuff Size: Large)   Pulse 92   Temp 97.9 F (36.6 C)   Ht 6' (1.829 m)   Wt 263 lb (119.3 kg)   SpO2 95%   BMI 35.67 kg/m   Physical Exam Constitutional:      Appearance: Normal appearance. He is well-developed.  HENT:     Head: Normocephalic and atraumatic.     Mouth/Throat:     Mouth: Mucous membranes are moist.     Pharynx: No oropharyngeal exudate or posterior oropharyngeal erythema.  Eyes:     General: No scleral icterus. Cardiovascular:     Rate and Rhythm: Normal rate and regular rhythm.  Pulmonary:     Effort: Pulmonary effort is normal.     Breath  sounds: Normal breath sounds.  Musculoskeletal:     Cervical back: Neck supple.  Lymphadenopathy:     Cervical: No cervical adenopathy.  Neurological:     General: No focal deficit present.     Mental Status: He is alert.  Psychiatric:        Mood and Affect: Mood normal.        Behavior: Behavior normal.    ------------------------------------------------------------------------------------------------------------------------------------------------------------------------------------------------------------------- Assessment and Plan  Pharyngitis Continued pharyngitis is likely related to postnasal drainage.  I recommend adding cetirizine and Flonase daily.  Warm salt water  gargles recommended.  Also recommend that he add a humidifier at home as he is using the heat in his house now.   No orders of the defined types were placed in this encounter.   No follow-ups on file.    This visit occurred during the SARS-CoV-2 public health emergency.  Safety protocols were in place, including screening questions prior to the visit, additional usage of staff PPE, and extensive cleaning of exam room while observing appropriate contact time as indicated for disinfecting solutions.

## 2021-04-11 ENCOUNTER — Other Ambulatory Visit: Payer: Self-pay | Admitting: Osteopathic Medicine

## 2021-04-11 NOTE — Telephone Encounter (Signed)
[facility] is requesting a med refill for sildenafil 100 mg. Written by historical provider.

## 2021-04-25 ENCOUNTER — Emergency Department (INDEPENDENT_AMBULATORY_CARE_PROVIDER_SITE_OTHER)
Admission: EM | Admit: 2021-04-25 | Discharge: 2021-04-25 | Disposition: A | Discharge status: Home / Self Care | Payer: BC Managed Care – PPO | Source: Home / Self Care

## 2021-04-25 ENCOUNTER — Emergency Department (INDEPENDENT_AMBULATORY_CARE_PROVIDER_SITE_OTHER): Payer: BC Managed Care – PPO

## 2021-04-25 DIAGNOSIS — S7012XA Contusion of left thigh, initial encounter: Secondary | ICD-10-CM

## 2021-04-25 DIAGNOSIS — M79605 Pain in left leg: Secondary | ICD-10-CM

## 2021-04-25 NOTE — Discharge Instructions (Addendum)
Advised patient DVT study of left leg was negative, patient has received copy of this report prior to discharge.  Advised patient if symptoms worsen and/or unresolved please follow-up with PCP for further evaluation.  Work note provided prior to discharge.

## 2021-04-25 NOTE — ED Triage Notes (Signed)
Pt c/o bruising to LT upper thigh that he noticed this morning while in the shower. Unknown how he got it. Not painful. Also requesting Cialis refill.

## 2021-04-25 NOTE — ED Provider Notes (Signed)
Shawn Richardson CARE    CSN: [id number] Arrival date & time: 04/25/21  0931      History   Chief Complaint Chief Complaint  Patient presents with   Bruising to Upper LT thigh    HPI Shawn Richardson is a 40 y.o. male.   HPI 40 year old male presents with bruising to left lower thigh he noticed this morning the shower.  Unknowing how he got this and is not painful.  Patient is requesting refill of Cialis.  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  Reports bruising of left inferior medial thigh is mildly painful.  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 difficile diarrhea 01/31/2019    History reviewed. No pertinent surgical history.     Home Medications    Prior to Admission medications   Medication Sig Start Date End Date Taking? Authorizing Provider  ibuprofen (ADVIL) 600 MG tablet Take 1 tablet (600 mg total) by mouth every 6 (six) hours as needed. 07/19/20   Merrilee Jansky, MD  tadalafil (CIALIS) 10 MG tablet Take 10 mg by mouth daily as needed for erectile dysfunction.    [provider]  lisinopril (ZESTRIL) 10 MG tablet TAKE 1 TABLET(10 MG) BY MOUTH DAILY Patient not taking: Reported on 07/19/2020 01/02/20 07/19/20  Sunnie Nielsen, DO  topiramate (TOPAMAX) 25 MG tablet Take one tablet at night for one week, then take 2 tablets at night for one week, then take 3 tablets at night. Patient not taking: Reported on 07/19/2020 02/02/19 07/19/20  York Spaniel, MD    Family History Family History  Problem Relation Age of Onset   Lung disease  Mother    Heart Problems Mother    Dementia Father    Gout Father     Social History Social History   Tobacco Use   Smoking status: Some Days    Types: Cigarettes   Smokeless tobacco: Never  Vaping Use   Vaping Use: Never used  Substance Use Topics   Alcohol use: Yes    Alcohol/week: 1.0 standard drink    Types: 1 Standard drinks or equivalent per week   Drug use: Never     Allergies   Patient has no known allergies.   Review of Systems Review of Systems  Hematological:        Bruising noted of left inferior medial aspect of thigh for 2 days.  All other systems reviewed and are negative.   Physical Exam Triage Vital Signs ED Triage Vitals [04/25/21 0955]  Enc Vitals Group     BP 132/88     Pulse Rate 68     Resp 17     Temp 97.8 F (36.6 C)     Temp Source Oral     SpO2 97 %     Weight      Height      Head Circumference      Peak Flow      Pain Score 0     Pain Loc  Pain Edu?      Excl. in GC?    No data found.  Updated Vital Signs BP 132/88 (BP Location: Right Arm)   Pulse 68   Temp 97.8 F (36.6 C) (Oral)   Resp 17   SpO2 97%      Physical Exam Vitals and nursing note reviewed.  Constitutional:      General: He is not in acute distress.    Appearance: Normal appearance. He is obese. He is not ill-appearing.  HENT:     Head: Normocephalic and atraumatic.     Nose: Nose normal.     Mouth/Throat:     Mouth: Mucous membranes are moist.     Pharynx: Oropharynx is clear.  Eyes:     Extraocular Movements: Extraocular movements intact.     Conjunctiva/sclera: Conjunctivae normal.     Pupils: Pupils are equal, round, and reactive to light.  Cardiovascular:     Rate and Rhythm: Normal rate and regular rhythm.     Pulses: Normal pulses.     Heart sounds: Normal heart sounds.  Pulmonary:     Effort: Pulmonary effort is normal.     Breath sounds: Normal breath sounds.  Musculoskeletal:        General: Normal range of motion.      Cervical back: Normal range of motion and neck supple.  Skin:    General: Skin is warm and dry.     Findings: Bruising present.     Comments: Left upper leg (inferior medial aspect): Notable ecchymosis and TTP x 2-3 days  Neurological:     General: No focal deficit present.     Mental Status: He is alert and oriented to person, place, and time. Mental status is at baseline.     UC Treatments / Results  Labs (all labs ordered are listed, but only abnormal results are displayed) Labs Reviewed - No data to display  EKG   Radiology US Venous Img Lower Unilateral Left  Result Date: 04/25/2021 CLINICAL DATA:  Distal thigh bruising, no known injury. Evaluate for DVT. EXAM: LEFT LOWER EXTREMITY VENOUS DOPPLER ULTRASOUND TECHNIQUE: Gray-scale sonography with compression, as well as color and duplex ultrasound, were performed to evaluate the deep venous system(s) from the level of the common femoral vein through the popliteal and proximal calf veins. COMPARISON:  None. FINDINGS: VENOUS Normal compressibility of the common femoral, superficial femoral, and popliteal veins, as well as the visualized calf veins. Visualized portions of profunda femoral vein and great saphenous vein unremarkable. No filling defects to suggest DVT on grayscale or color Doppler imaging. Doppler waveforms show normal direction of venous flow, normal respiratory plasticity and response to augmentation. Limited views of the contralateral common femoral vein are unremarkable. OTHER None. Limitations: none IMPRESSION: Negative. Electronically Signed   By: Malachy Moan M.D.   On: 04/25/2021 12:07    Procedures Procedures (including critical care time)  Medications Ordered in UC Medications - No data to display  Initial Impression / Assessment and Plan / UC Course  I have reviewed the triage vital signs and the nursing notes.  Pertinent labs & imaging results that were available during my care of the patient were  reviewed by me and considered in my medical decision making (see chart for details).     MDM: 1. Left leg pain-dictated left leg ultrasound study revealed above, patient provided copy of this study.  Advised patient if symptoms worsen and/or unresolved please follow-up with PCP for further evaluation. Work note  provided prior to discharge.  Patient discharged home, hemodynamically stable.  Final Clinical Impressions(s) / UC Diagnoses   Final diagnoses:  Left leg pain     Discharge Instructions      Advised patient DVT study of left leg was negative, patient has received copy of this report prior to discharge.  Advised patient if symptoms worsen and/or unresolved please follow-up with PCP for further evaluation.  Work note provided prior to discharge.     ED Prescriptions   None    PDMP not reviewed this encounter.   Trevor Iha, FNP 04/25/21 1225

## 2021-05-22 ENCOUNTER — Telehealth (INDEPENDENT_AMBULATORY_CARE_PROVIDER_SITE_OTHER): Payer: BC Managed Care – PPO | Admitting: Sports Medicine

## 2021-05-22 DIAGNOSIS — U071 COVID-19: Secondary | ICD-10-CM | POA: Diagnosis not present

## 2021-05-22 MED ORDER — TADALAFIL 10 MG PO TABS: 10.0000 mg | ORAL_TABLET | Freq: Every day | ORAL | 11 refills | Status: DC | PRN

## 2021-05-22 MED ORDER — NIRMATRELVIR/RITONAVIR (PAXLOVID)TABLET: ORAL_TABLET | ORAL | 0 refills | Status: DC

## 2021-05-22 NOTE — Progress Notes (Signed)
Symptoms started on Friday, 12/23 but due to the Holiday he could not get a covid test until Monday, 12/26 which was positive. Sx include: body aches, cough, congestion, no taste/smell.

## 2021-05-22 NOTE — Progress Notes (Signed)
° °  Virtual Visit via Telephone   I connected with  Shawn Richardson  on 05/22/21 by telephone/telehealth and verified that I am speaking with the correct person using two identifiers.   I discussed the limitations, risks, security and privacy concerns of performing an evaluation and management service by telephone, including the higher likelihood of inaccurate diagnosis and treatment, and the availability of in person appointments.  We also discussed the likely need of an additional face to face encounter for complete and high quality delivery of care.  I also discussed with the patient that there may be a patient responsible charge related to this service. The patient expressed understanding and wishes to proceed.  Provider location is in medical facility. Patient location is at their home, different from provider location. People involved in care of the patient during this telehealth encounter were myself, my nurse/medical assistant, and my front office/scheduling team member.  Review of Systems: No fevers, chills, night sweats, weight loss, chest pain, or shortness of breath.   Objective Findings:    General: Speaking full sentences, no audible heavy breathing.  Sounds alert and appropriately interactive.    Independent interpretation of tests performed by another provider:   None.  Brief History, Exam, Impression, and Recommendations:    COVID-19 Diagnosed with COVID on Monday, adding Paxlovid, he can do over-the-counter cold and flu medications. Out of work until next Monday.  I discussed the above assessment and treatment plan with the patient. The patient was provided an opportunity to ask questions and all were answered. The patient agreed with the plan and demonstrated an understanding of the instructions.   The patient was advised to call back or seek an in-person evaluation if the symptoms worsen or if the condition fails to improve as anticipated.   I provided 30 minutes of  verbal and non-verbal time during this encounter date, time was needed to gather information, review chart, records, communicate/coordinate with staff remotely, as well as complete documentation.   ___________________________________________ Ihor Austin. Benjamin Stain, M.D., ABFM., CAQSM. Primary Care and Sports Medicine [facility]  Adjunct Professor of Family Medicine  [employer]

## 2021-05-22 NOTE — Assessment & Plan Note (Signed)
Diagnosed with COVID on Monday, adding Paxlovid, he can do over-the-counter cold and flu medications. Out of work until next Monday.

## 2021-06-02 ENCOUNTER — Other Ambulatory Visit: Payer: Self-pay

## 2021-06-02 ENCOUNTER — Ambulatory Visit (INDEPENDENT_AMBULATORY_CARE_PROVIDER_SITE_OTHER): Payer: BC Managed Care – PPO | Admitting: Family Medicine

## 2021-06-02 ENCOUNTER — Encounter: Payer: Self-pay | Admitting: Family Medicine

## 2021-06-02 VITALS — BP 138/86 | HR 79 | Temp 98.3°F | Ht 72.0 in | Wt 268.0 lb

## 2021-06-02 DIAGNOSIS — L0291 Cutaneous abscess, unspecified: Secondary | ICD-10-CM

## 2021-06-02 DIAGNOSIS — F321 Major depressive disorder, single episode, moderate: Secondary | ICD-10-CM | POA: Diagnosis not present

## 2021-06-02 MED ORDER — DOXYCYCLINE HYCLATE 100 MG PO TABS: 100.0000 mg | ORAL_TABLET | Freq: Two times a day (BID) | ORAL | 0 refills | Status: DC

## 2021-06-02 MED ORDER — DOXYCYCLINE HYCLATE 100 MG PO TABS: 100.0000 mg | ORAL_TABLET | Freq: Two times a day (BID) | ORAL | 0 refills | Status: AC

## 2021-06-02 NOTE — Progress Notes (Signed)
Shawn Richardson - 41 y.o. male MRN [id number]  Date of birth: 1981/05/01  Subjective No chief complaint on file.   HPI Shawn Richardson is a 41 year old male here today with complaint of increased anxiety as well as possible rash.  Has had increased anxiety over the past several months.  He is requesting referral to psychiatry for management of this.  He does not want to start any medication at this time.  Also reports noticing some "bumps" on his abdomen and scrotal area that occurred after he had COVID a few weeks ago.  These are somewhat painful.  They have gotten a little larger since initially appearing.  He denies any drainage.  He has not had fever or chills.  ROS:  A comprehensive ROS was completed and negative except as noted per HPI  No Known Allergies  Past Medical History:  Diagnosis Date   Headaches, cluster    Hypertension    Vertigo     History reviewed. No pertinent surgical history.  Social History   Socioeconomic History   Marital status: Single    Spouse name: Not on file   Number of children: Not on file   Years of education: Not on file   Highest education level: Not on file  Occupational History   Not on file  Tobacco Use   Smoking status: Some Days    Types: Cigarettes   Smokeless tobacco: Never  Vaping Use   Vaping Use: Never used  Substance and Sexual Activity   Alcohol use: Yes    Alcohol/week: 1.0 standard drink    Types: 1 Standard drinks or equivalent per week   Drug use: Never   Sexual activity: Yes  Other Topics Concern   Not on file  Social History Narrative   Left handed   Caffeine~ 1-2 cups per day   Lives at home with girlfriend    Social Determinants of Health   Financial Resource Strain: Not on file  Food Insecurity: Not on file  Transportation Needs: Not on file  Physical Activity: Not on file  Stress: Not on file  Social Connections: Not on file    Family History  Problem Relation Age of Onset   Lung disease Mother     Heart Problems Mother    Dementia Father    Gout Father     Health Maintenance  Topic Date Due   Pneumococcal Vaccine 16-48 Years old (1 - PCV) Never done   HIV Screening  Never done   Hepatitis C Screening  Never done   COVID-19 Vaccine (3 - Booster for Moderna series) 02/10/2020   INFLUENZA VACCINE  08/22/2021 (Originally 12/23/2020)   TETANUS/TDAP  02/04/2029   HPV VACCINES  Aged Out     ----------------------------------------------------------------------------------------------------------------------------------------------------------------------------------------------------------------- Physical Exam BP 138/86 (BP Location: Left Arm, Patient Position: Sitting, Cuff Size: Large)    Pulse 79    Temp 98.3 F (36.8 C)    Ht 6' (1.829 m)    Wt 268 lb (121.6 kg)    SpO2 94%    BMI 36.35 kg/m   Physical Exam Constitutional:      Appearance: Normal appearance.  Cardiovascular:     Rate and Rhythm: Normal rate and regular rhythm.  Pulmonary:     Effort: Pulmonary effort is normal.     Breath sounds: Normal breath sounds.  Musculoskeletal:     Cervical back: Neck supple.  Skin:    Comments: Small cutaneous abscess along the right lower abdominal wall and left scrotal area.  There is no fluctuance or drainage from these areas.  Neurological:     Mental Status: He is alert.  Psychiatric:        Mood and Affect: Mood normal.        Behavior: Behavior normal.    ------------------------------------------------------------------------------------------------------------------------------------------------------------------------------------------------------------------- Assessment and Plan  Cutaneous abscess No significant fluctuance or indication for I&D at this time.  Adding doxycycline 100 mg twice daily x10 days.  Instructed to return to clinic if these are worsening.  Depression, major, single episode, moderate (HCC) Declines any medication at this time.  Given  resources for mental health urgent care in [facility].  Referral placed to psychiatry.   Meds ordered this encounter  Medications   DISCONTD: doxycycline (VIBRA-TABS) 100 MG tablet    Sig: Take 1 tablet (100 mg total) by mouth 2 (two) times daily for 10 days.    Dispense:  20 tablet    Refill:  0   doxycycline (VIBRA-TABS) 100 MG tablet    Sig: Take 1 tablet (100 mg total) by mouth 2 (two) times daily for 10 days.    Dispense:  20 tablet    Refill:  0    No follow-ups on file.    This visit occurred during the SARS-CoV-2 public health emergency.  Safety protocols were in place, including screening questions prior to the visit, additional usage of staff PPE, and extensive cleaning of exam room while observing appropriate contact time as indicated for disinfecting solutions.

## 2021-06-02 NOTE — Assessment & Plan Note (Signed)
Declines any medication at this time.  Given resources for mental health urgent care in [facility].  Referral placed to psychiatry.

## 2021-06-02 NOTE — Assessment & Plan Note (Signed)
No significant fluctuance or indication for I&D at this time.  Adding doxycycline 100 mg twice daily x10 days.  Instructed to return to clinic if these are worsening.

## 2021-06-16 ENCOUNTER — Encounter: Payer: BC Managed Care – PPO | Admitting: Family Medicine

## 2021-06-18 ENCOUNTER — Other Ambulatory Visit: Payer: Self-pay

## 2021-06-18 ENCOUNTER — Ambulatory Visit (INDEPENDENT_AMBULATORY_CARE_PROVIDER_SITE_OTHER): Payer: BC Managed Care – PPO | Admitting: Family Medicine

## 2021-06-18 ENCOUNTER — Encounter: Payer: Self-pay | Admitting: Family Medicine

## 2021-06-18 VITALS — BP 144/86 | HR 69 | Temp 97.6°F | Ht 72.0 in | Wt 271.0 lb

## 2021-06-18 DIAGNOSIS — Z0100 Encounter for examination of eyes and vision without abnormal findings: Secondary | ICD-10-CM | POA: Insufficient documentation

## 2021-06-18 DIAGNOSIS — Z13220 Encounter for screening for lipoid disorders: Secondary | ICD-10-CM

## 2021-06-18 DIAGNOSIS — Z0000 Encounter for general adult medical examination without abnormal findings: Secondary | ICD-10-CM

## 2021-06-18 DIAGNOSIS — R030 Elevated blood-pressure reading, without diagnosis of hypertension: Secondary | ICD-10-CM | POA: Diagnosis not present

## 2021-06-18 DIAGNOSIS — R7309 Other abnormal glucose: Secondary | ICD-10-CM | POA: Diagnosis not present

## 2021-06-18 NOTE — Assessment & Plan Note (Signed)
Well adult Orders Placed This Encounter  Procedures   COMPLETE METABOLIC PANEL WITH GFR   CBC with Differential   Lipid Panel w/reflex Direct LDL   TSH  Screenings: Per lab orders Immunizations: Declines flu vaccine Anticipatory guidance/risk factor reduction: Recommendations per AVS. 

## 2021-06-18 NOTE — Progress Notes (Signed)
Shawn Richardson - 41 y.o. male MRN [id number]  Date of birth: Jun 23, 1980  Subjective Chief Complaint  Patient presents with   Annual Exam    HPI Shawn Richardson is a 41 year old male here today for annual exam.  Reports he is doing fairly well at this time.  No new concerns at this time.  He does smoke approximately 3 cigarettes/day.  He denies alcohol use.  He has started to exercise more recently.  Feels like his diet is pretty good but could be improved.  Review of Systems  Constitutional:  Negative for chills, fever, malaise/fatigue and weight loss.  HENT:  Negative for congestion, ear pain and sore throat.   Eyes:  Negative for blurred vision, double vision and pain.  Respiratory:  Negative for cough and shortness of breath.   Cardiovascular:  Negative for chest pain and palpitations.  Gastrointestinal:  Negative for abdominal pain, blood in stool, constipation, heartburn and nausea.  Genitourinary:  Negative for dysuria and urgency.  Musculoskeletal:  Negative for joint pain and myalgias.  Neurological:  Negative for dizziness and headaches.  Endo/Heme/Allergies:  Does not bruise/bleed easily.  Psychiatric/Behavioral:  Negative for depression. The patient is not nervous/anxious and does not have insomnia.    No Known Allergies  Past Medical History:  Diagnosis Date   Headaches, cluster    Hypertension    Vertigo     History reviewed. No pertinent surgical history.  Social History   Socioeconomic History   Marital status: Single    Spouse name: Not on file   Number of children: Not on file   Years of education: Not on file   Highest education level: Not on file  Occupational History   Not on file  Tobacco Use   Smoking status: Some Days    Types: Cigarettes   Smokeless tobacco: Never  Vaping Use   Vaping Use: Never used  Substance and Sexual Activity   Alcohol use: Yes    Alcohol/week: 1.0 standard drink    Types: 1 Standard drinks or equivalent per week   Drug  use: Never   Sexual activity: Yes  Other Topics Concern   Not on file  Social History Narrative   Left handed   Caffeine~ 1-2 cups per day   Lives at home with girlfriend    Social Determinants of Health   Financial Resource Strain: Not on file  Food Insecurity: Not on file  Transportation Needs: Not on file  Physical Activity: Not on file  Stress: Not on file  Social Connections: Not on file    Family History  Problem Relation Age of Onset   Lung disease Mother    Heart Problems Mother    Dementia Father    Gout Father     Health Maintenance  Topic Date Due   HIV Screening  Never done   Hepatitis C Screening  Never done   COVID-19 Vaccine (3 - Booster for Moderna series) 02/10/2020   INFLUENZA VACCINE  08/22/2021 (Originally 12/23/2020)   TETANUS/TDAP  02/04/2029   HPV VACCINES  Aged Out     ----------------------------------------------------------------------------------------------------------------------------------------------------------------------------------------------------------------- Physical Exam BP (!) 144/86 (BP Location: Left Arm, Patient Position: Sitting, Cuff Size: Large)    Pulse 69    Temp 97.6 F (36.4 C)    Ht 6' (1.829 m)    Wt 271 lb (122.9 kg)    SpO2 94%    BMI 36.75 kg/m   Physical Exam Constitutional:      General: He is not in  acute distress. HENT:     Head: Normocephalic and atraumatic.     Right Ear: Tympanic membrane and external ear normal.     Left Ear: Tympanic membrane and external ear normal.  Eyes:     General: No scleral icterus. Neck:     Thyroid: No thyromegaly.  Cardiovascular:     Rate and Rhythm: Normal rate and regular rhythm.     Heart sounds: Normal heart sounds.  Pulmonary:     Effort: Pulmonary effort is normal.     Breath sounds: Normal breath sounds.  Abdominal:     General: Bowel sounds are normal. There is no distension.     Palpations: Abdomen is soft.     Tenderness: There is no abdominal  tenderness. There is no guarding.  Musculoskeletal:     Cervical back: Normal range of motion.  Lymphadenopathy:     Cervical: No cervical adenopathy.  Skin:    General: Skin is warm and dry.     Findings: No rash.  Neurological:     Mental Status: He is alert and oriented to person, place, and time.     Cranial Nerves: No cranial nerve deficit.     Motor: No abnormal muscle tone.  Psychiatric:        Mood and Affect: Mood normal.        Behavior: Behavior normal.    ------------------------------------------------------------------------------------------------------------------------------------------------------------------------------------------------------------------- Assessment and Plan  Well adult exam Well adult Orders Placed This Encounter  Procedures   COMPLETE METABOLIC PANEL WITH GFR   CBC with Differential   Lipid Panel w/reflex Direct LDL   TSH  Screenings: Per lab orders Immunizations: Declines flu vaccine Anticipatory guidance/risk factor reduction: Recommendations per AVS.   No orders of the defined types were placed in this encounter.   No follow-ups on file.    This visit occurred during the SARS-CoV-2 public health emergency.  Safety protocols were in place, including screening questions prior to the visit, additional usage of staff PPE, and extensive cleaning of exam room while observing appropriate contact time as indicated for disinfecting solutions.

## 2021-06-18 NOTE — Patient Instructions (Signed)

## 2021-06-25 LAB — COMPLETE METABOLIC PANEL WITH GFR
AG Ratio: 1.6 (calc) (ref 1.0–2.5)
ALT: 31 U/L (ref 9–46)
AST: 25 U/L (ref 10–40)
Albumin: 4.2 g/dL (ref 3.6–5.1)
Alkaline phosphatase (APISO): 57 U/L (ref 36–130)
BUN: 15 mg/dL (ref 7–25)
CO2: 29 mmol/L (ref 20–32)
Calcium: 9.3 mg/dL (ref 8.6–10.3)
Chloride: 105 mmol/L (ref 98–110)
Creat: 1.1 mg/dL (ref 0.60–1.29)
Globulin: 2.6 g/dL (calc) (ref 1.9–3.7)
Glucose, Bld: 149 mg/dL — ABNORMAL HIGH (ref 65–99)
Potassium: 3.9 mmol/L (ref 3.5–5.3)
Sodium: 140 mmol/L (ref 135–146)
Total Bilirubin: 0.4 mg/dL (ref 0.2–1.2)
Total Protein: 6.8 g/dL (ref 6.1–8.1)
eGFR: 86 mL/min/{1.73_m2} (ref >=60)

## 2021-06-25 LAB — CBC WITH DIFFERENTIAL/PLATELET
Absolute Monocytes: 713 cells/uL (ref 200–950)
Basophils Absolute: 7 cells/uL (ref 0–200)
Basophils Relative: 0.1 %
Eosinophils Absolute: 50 cells/uL (ref 15–500)
Eosinophils Relative: 0.7 %
HCT: 40.3 % (ref 38.5–50.0)
Hemoglobin: 12.9 g/dL — ABNORMAL LOW (ref 13.2–17.1)
Lymphs Abs: 2138 cells/uL (ref 850–3900)
MCH: 30.2 pg (ref 27.0–33.0)
MCHC: 32 g/dL (ref 32.0–36.0)
MCV: 94.4 fL (ref 80.0–100.0)
MPV: 10.6 fL (ref 7.5–12.5)
Monocytes Relative: 9.9 %
Neutro Abs: 4291 cells/uL (ref 1500–7800)
Neutrophils Relative %: 59.6 %
Platelets: 248 10*3/uL (ref 140–400)
RBC: 4.27 10*6/uL (ref 4.20–5.80)
RDW: 11.1 % (ref 11.0–15.0)
Total Lymphocyte: 29.7 %
WBC: 7.2 10*3/uL (ref 3.8–10.8)

## 2021-06-25 LAB — TEST AUTHORIZATION

## 2021-06-25 LAB — LIPID PANEL W/REFLEX DIRECT LDL
Cholesterol: 142 mg/dL (ref <200)
HDL: 34 mg/dL — ABNORMAL LOW (ref >=40)
LDL Cholesterol (Calc): 80 mg/dL (calc)
Non-HDL Cholesterol (Calc): 108 mg/dL (calc) (ref <130)
Total CHOL/HDL Ratio: 4.2 (calc) (ref <5.0)
Triglycerides: 186 mg/dL — ABNORMAL HIGH (ref <150)

## 2021-06-25 LAB — HEMOGLOBIN A1C W/OUT EAG: Hgb A1c MFr Bld: 4.8 % of total Hgb (ref <5.7)

## 2021-06-25 LAB — TSH: TSH: 1.51 mIU/L (ref 0.40–4.50)

## 2021-07-15 ENCOUNTER — Ambulatory Visit (HOSPITAL_COMMUNITY): Payer: Self-pay | Admitting: Psychiatry

## 2021-08-20 ENCOUNTER — Ambulatory Visit (INDEPENDENT_AMBULATORY_CARE_PROVIDER_SITE_OTHER): Payer: BC Managed Care – PPO

## 2021-08-20 ENCOUNTER — Ambulatory Visit (INDEPENDENT_AMBULATORY_CARE_PROVIDER_SITE_OTHER): Payer: BC Managed Care – PPO | Admitting: Sports Medicine

## 2021-08-20 DIAGNOSIS — M175 Other unilateral secondary osteoarthritis of knee: Secondary | ICD-10-CM

## 2021-08-20 DIAGNOSIS — M25561 Pain in right knee: Secondary | ICD-10-CM | POA: Diagnosis not present

## 2021-08-20 DIAGNOSIS — Z09 Encounter for follow-up examination after completed treatment for conditions other than malignant neoplasm: Secondary | ICD-10-CM

## 2021-08-20 MED ORDER — MELOXICAM 15 MG PO TABS: ORAL_TABLET | ORAL | 3 refills | Status: DC

## 2021-08-20 NOTE — Assessment & Plan Note (Signed)
Shawn Richardson is a pleasant 41 year old male, he had his ACL reconstruction about 20 years ago, unfortunately starting to have increasing pain, swelling right knee medial joint line, posterior knee, moderate swelling. ?No trauma, no mechanical symptoms. ?On exam he has an effusion, a little bit of tightness with flexion but no pain, negative McMurray's sign. 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 ?Discontinue ibuprofen.  ?

## 2021-08-20 NOTE — Progress Notes (Signed)
? ? ?  Procedures performed today:   ? ?None. ? ?Independent interpretation of notes and tests performed by another provider:  ? ?None. ? ?Brief History, Exam, Impression, and Recommendations:   ? ?Secondary osteoarthritis of right knee ?Shawn Richardson is a pleasant 41 year old male, he had his ACL reconstruction about 20 years ago, unfortunately starting to have increasing pain, swelling right knee medial joint line, posterior knee, moderate swelling. ?No trauma, no mechanical symptoms. ?On exam he has an effusion, a little bit of tightness with flexion but no pain, negative McMurray's sign. 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 ?Discontinue ibuprofen.  ? ?Chronic process with exacerbation and pharmacologic intervention ? ?___________________________________________ ?Ihor Austin. Benjamin Stain, M.D., ABFM., CAQSM. ?Primary Care and Sports Medicine ?[facility] ? ?Adjunct Instructor of Family Medicine  ?[employer] ?

## 2021-09-17 ENCOUNTER — Ambulatory Visit (INDEPENDENT_AMBULATORY_CARE_PROVIDER_SITE_OTHER): Payer: BC Managed Care – PPO

## 2021-09-17 ENCOUNTER — Ambulatory Visit (INDEPENDENT_AMBULATORY_CARE_PROVIDER_SITE_OTHER): Payer: BC Managed Care – PPO | Admitting: Sports Medicine

## 2021-09-17 DIAGNOSIS — M175 Other unilateral secondary osteoarthritis of knee: Secondary | ICD-10-CM | POA: Diagnosis not present

## 2021-09-17 NOTE — Assessment & Plan Note (Signed)
Very pleasant 40 year old male, history of ACL reconstruction 20 years ago, increasing pain, x-rays showed arthritis. ?Not better with meloxicam and other conservative treatment so we did an aspiration and injection today, return to see me in 6 weeks. ?

## 2021-09-17 NOTE — Progress Notes (Addendum)
? ? ?  Procedures performed today:   ? ?Procedure: Real-time Ultrasound Guided injection of right knee ?Device: Samsung HS60  ?Verbal informed consent obtained.  ?Time-out conducted.  ?Noted no overlying erythema, induration, or other signs of local infection.  ?Skin prepped in a sterile fashion.  ?Local anesthesia: Topical Ethyl chloride.  ?With sterile technique and under real time ultrasound guidance: Noted mild effusion, 1 cc Kenalog 40, 2 cc lidocaine, 2 cc bupivacaine injected easily ?Completed without difficulty  ?Advised to call if fevers/chills, erythema, induration, drainage, or persistent bleeding.  ?Images permanently stored and available for review in PACS.  ?Impression: Technically successful ultrasound guided injection. ? ?Independent interpretation of notes and tests performed by another provider:  ? ?None. ? ?Brief History, Exam, Impression, and Recommendations:   ? ?Secondary osteoarthritis of right knee ?Very pleasant 41 year old male, history of ACL reconstruction 20 years ago, increasing pain, x-rays showed arthritis. ?Not better with meloxicam and other conservative treatment so we did an aspiration and injection today, return to see me in 6 weeks. ? ? ? ?___________________________________________ ?Ihor Austin. Benjamin Stain, M.D., ABFM., CAQSM. ?Primary Care and Sports Medicine ?[facility] ? ?Adjunct Instructor of Family Medicine  ?[employer] ?

## 2021-10-29 ENCOUNTER — Ambulatory Visit: Payer: Self-pay | Admitting: Sports Medicine

## 2021-11-10 ENCOUNTER — Ambulatory Visit: Payer: Managed Care, Other (non HMO) | Admitting: Family Medicine

## 2021-11-10 ENCOUNTER — Encounter: Payer: Self-pay | Admitting: Family Medicine

## 2021-11-10 DIAGNOSIS — I10 Essential (primary) hypertension: Secondary | ICD-10-CM | POA: Diagnosis not present

## 2021-11-10 DIAGNOSIS — G4489 Other headache syndrome: Secondary | ICD-10-CM

## 2021-11-10 MED ORDER — TOPIRAMATE 25 MG PO TABS: ORAL_TABLET | ORAL | 3 refills | Status: DC

## 2021-11-10 NOTE — Progress Notes (Signed)
Shawn Richardson - 41 y.o. male MRN [id number]  Date of birth: [birth year]  Subjective Chief Complaint  Patient presents with   Headache    HPI Shawn Richardson is a 41 y.o. male here today for follow up of headaches.  Seen by neurology previously.  Was prescribed topiramate previously however has discontinued.  He is also off of his blood pressure medication.  Headaches are similar to his previous headaches.  He has not had any neurological changes including vision changes, weakness, numbness or tingling.  He denies light or sound sensitivity or associated nausea at this time.   ROS:  A comprehensive ROS was completed and negative except as noted per HPI  No Known Allergies  Past Medical History:  Diagnosis Date   Headaches, cluster    Hypertension    Vertigo     No past surgical history on file.  Social History   Socioeconomic History   Marital status: Single    Spouse name: Not on file   Number of children: Not on file   Years of education: Not on file   Highest education level: Not on file  Occupational History   Not on file  Tobacco Use   Smoking status: Some Days    Types: Cigarettes   Smokeless tobacco: Never  Vaping Use   Vaping Use: Never used  Substance and Sexual Activity   Alcohol use: Yes    Alcohol/week: 1.0 standard drink of alcohol    Types: 1 Standard drinks or equivalent per week   Drug use: Never   Sexual activity: Yes  Other Topics Concern   Not on file  Social History Narrative   Left handed   Caffeine~ 1-2 cups per day   Lives at home with girlfriend    Social Determinants of Health   Financial Resource Strain: Not on file  Food Insecurity: Not on file  Transportation Needs: Not on file  Physical Activity: Not on file  Stress: Not on file  Social Connections: Not on file    Family History  Problem Relation Age of Onset   Lung disease Mother    Heart Problems Mother    Dementia Father    Gout Father     Health Maintenance   Topic Date Due   HIV Screening  Never done   Hepatitis C Screening  Never done   COVID-19 Vaccine (3 - Moderna series) 02/10/2020   INFLUENZA VACCINE  12/23/2021   TETANUS/TDAP  02/04/2029   HPV VACCINES  Aged Out     ----------------------------------------------------------------------------------------------------------------------------------------------------------------------------------------------------------------- Physical Exam BP 135/82   Physical Exam Constitutional:      Appearance: He is well-developed.  Eyes:     General: No scleral icterus. Cardiovascular:     Rate and Rhythm: Normal rate and regular rhythm.  Pulmonary:     Effort: Pulmonary effort is normal.     Breath sounds: Normal breath sounds.  Musculoskeletal:     Cervical back: Neck supple.  Neurological:     General: No focal deficit present.     Mental Status: He is alert.  Psychiatric:        Mood and Affect: Mood normal.        Behavior: Behavior normal.     ------------------------------------------------------------------------------------------------------------------------------------------------------------------------------------------------------------------- Assessment and Plan  Headache syndrome Recommend restarting topiramate with titration over the next few weeks.  Essential hypertension BP is fairly well controlled at this time.  We'll hold off on restarting lisinopril for now.    No orders of the  defined types were placed in this encounter.   No follow-ups on file.    This visit occurred during the SARS-CoV-2 public health emergency.  Safety protocols were in place, including screening questions prior to the visit, additional usage of staff PPE, and extensive cleaning of exam room while observing appropriate contact time as indicated for disinfecting solutions.

## 2021-11-10 NOTE — Patient Instructions (Addendum)
  Restart topiramate to help with reduction in headaches.  See me again in 4-6 weeks.

## 2021-11-10 NOTE — Assessment & Plan Note (Addendum)
BP is fairly well controlled at this time.  We'll hold off on restarting lisinopril for now.

## 2021-11-10 NOTE — Assessment & Plan Note (Addendum)
Recommend restarting topiramate with titration over the next few weeks.

## 2021-12-15 ENCOUNTER — Ambulatory Visit: Payer: Managed Care, Other (non HMO) | Admitting: Family Medicine

## 2021-12-15 ENCOUNTER — Encounter: Payer: Self-pay | Admitting: Family Medicine

## 2021-12-15 DIAGNOSIS — I10 Essential (primary) hypertension: Secondary | ICD-10-CM

## 2021-12-15 DIAGNOSIS — S60419A Abrasion of unspecified finger, initial encounter: Secondary | ICD-10-CM | POA: Diagnosis not present

## 2021-12-15 MED ORDER — LISINOPRIL 10 MG PO TABS: ORAL_TABLET | ORAL | 0 refills | Status: DC

## 2021-12-15 NOTE — Assessment & Plan Note (Signed)
No signs of infection at this time.  Recommend that he continue to keep clean with soap and water.  May cover with OTC antibiotic ointment and bandaid.  Signs of infection reviewed with him.

## 2021-12-15 NOTE — Patient Instructions (Signed)
Restart lisinopril for blood pressure

## 2021-12-15 NOTE — Assessment & Plan Note (Signed)
BP is elevated today.  Adding lisinopril 10mg  back on.

## 2021-12-15 NOTE — Progress Notes (Signed)
Shawn Richardson - 41 y.o. male MRN [id number]  Date of birth: 24-Nov-1980  Subjective Chief Complaint  Patient presents with   Finger Laceration    HPI Shawn Richardson is a 41 y.o. male here today with complaint of cut on his thumb.  Fell while moving and scraped finger on furniture last week.  Wants to be sure it isn't infected.  He is not having pain, swelling, redness or drainage from the wound.   BP is elevated today.  Previously on lisinopril and tolerated well.  Denies symptoms of HTN including chest pain, shortness of breath, palpitations, headache or vision changes.   ROS:  A comprehensive ROS was completed and negative except as noted per HPI  No Known Allergies  Past Medical History:  Diagnosis Date   Headaches, cluster    Hypertension    Vertigo     History reviewed. No pertinent surgical history.  Social History   Socioeconomic History   Marital status: Single    Spouse name: Not on file   Number of children: Not on file   Years of education: Not on file   Highest education level: Not on file  Occupational History   Not on file  Tobacco Use   Smoking status: Some Days    Types: Cigarettes   Smokeless tobacco: Never  Vaping Use   Vaping Use: Never used  Substance and Sexual Activity   Alcohol use: Yes    Alcohol/week: 1.0 standard drink of alcohol    Types: 1 Standard drinks or equivalent per week   Drug use: Never   Sexual activity: Yes  Other Topics Concern   Not on file  Social History Narrative   Left handed   Caffeine~ 1-2 cups per day   Lives at home with girlfriend    Social Determinants of Health   Financial Resource Strain: Not on file  Food Insecurity: Not on file  Transportation Needs: Not on file  Physical Activity: Not on file  Stress: Not on file  Social Connections: Not on file    Family History  Problem Relation Age of Onset   Lung disease Mother    Heart Problems Mother    Dementia Father    Gout Father     Health  Maintenance  Topic Date Due   COVID-19 Vaccine (3 - Moderna series) 03/17/2022 (Originally 02/10/2020)   Hepatitis C Screening  12/16/2022 (Originally 02/02/1998)   HIV Screening  12/16/2022 (Originally 02/03/1995)   INFLUENZA VACCINE  12/23/2021   TETANUS/TDAP  02/04/2029   HPV VACCINES  Aged Out     ----------------------------------------------------------------------------------------------------------------------------------------------------------------------------------------------------------------- Physical Exam BP (!) 148/94 (BP Location: Left Arm, Patient Position: Sitting, Cuff Size: Large)   Pulse 90   Ht 6' (1.829 m)   Wt 277 lb (125.6 kg)   SpO2 94%   BMI 37.57 kg/m   Physical Exam Constitutional:      Appearance: Normal appearance.  Musculoskeletal:     Cervical back: Neck supple.  Skin:    Comments: Small abrasion to R thumb.  No redness, drainage or swelling.    Neurological:     Mental Status: He is alert.  Psychiatric:        Mood and Affect: Mood normal.        Behavior: Behavior normal.     ------------------------------------------------------------------------------------------------------------------------------------------------------------------------------------------------------------------- Assessment and Plan  Abrasion of skin of finger of right hand No signs of infection at this time.  Recommend that he continue to keep clean with soap and water.  May cover with OTC antibiotic ointment and bandaid.  Signs of infection reviewed with him.    Essential hypertension BP is elevated today.  Adding lisinopril 10mg  back on.    Meds ordered this encounter  Medications   lisinopril (ZESTRIL) 10 MG tablet    Sig: TAKE 1 TABLET(10 MG) BY MOUTH DAILY    Dispense:  90 tablet    Refill:  0    No follow-ups on file.    This visit occurred during the SARS-CoV-2 public health emergency.  Safety protocols were in place, including screening questions  prior to the visit, additional usage of staff PPE, and extensive cleaning of exam room while observing appropriate contact time as indicated for disinfecting solutions.

## 2021-12-22 ENCOUNTER — Ambulatory Visit: Payer: Managed Care, Other (non HMO) | Admitting: Family Medicine

## 2022-02-13 ENCOUNTER — Ambulatory Visit: Payer: Managed Care, Other (non HMO) | Admitting: Family Medicine

## 2022-06-19 ENCOUNTER — Encounter: Payer: BC Managed Care – PPO | Admitting: Family Medicine

## 2022-08-05 ENCOUNTER — Other Ambulatory Visit: Payer: Self-pay | Admitting: Pharmacist

## 2022-08-05 NOTE — Patient Instructions (Signed)
Manfred,  Thank you for speaking with me today. As discussed, lets get you back on schedule with Dr. Zigmund Daniel. Consider getting a blood pressure monitor at home to check your readings - you will want to make sure it goes around your arm, not the wrist. The brand "omron" has been verified to be accurate, and you can find it on most pharmacy shelves for around $30-35.   Below are some helpful tips with checking blood pressure:  Check your blood pressure periodically, and any time you have concerning symptoms like headache, chest pain, dizziness, shortness of breath, or vision changes.   Our goal is less than 140/90.  To appropriately check your blood pressure, make sure you do the following:  1) Avoid caffeine, exercise, or tobacco products for 30 minutes before checking. Empty your bladder. 2) Sit with your back supported in a flat-backed chair. Rest your arm on something flat (arm of the chair, table, etc). 3) Sit still with your feet flat on the floor, resting, for at least 5 minutes.  4) Check your blood pressure. Take 1-2 readings.  5) Write down these readings and bring with you to any provider appointments.  Bring your home blood pressure machine with you to a provider's office for accuracy comparison at least once a year.   Make sure you take your blood pressure medications before you come to any office visit, even if you were asked to fast for labs.   Take care, Luana Shu, PharmD Clinical Pharmacist [facility] [phone]

## 2022-08-05 NOTE — Progress Notes (Signed)
Patient appearing on report for [employer] [facility] - Hypertension Control report due to last documented ambulatory blood pressure of 148/94 on 12/15/21. Next appointment with PCP is not scheduled   Outreached patient to discuss hypertension control and medication management.   Current antihypertensives: not currently taking lisinopril, on no medication for blood pressure  Patient does not have an automated upper arm home BP machine.  Current blood pressure readings: not currently checking   Patient denies hypotensive signs and symptoms including dizziness, lightheadedness.  Patient denies hypertensive symptoms including headache, chest pain, shortness of breath.    Assessment/Plan: - Currently uncontrolled - - Reviewed goal blood pressure <140/90 - Counseled on long term microvascular and macrovascular complications of uncontrolled hypertension - Reviewed to check blood pressure periodically out at pharmacy or grocery store, document, and provide at next provider visit - Discussed dietary modifications, such as reduced salt intake, focus on whole grains, vegetables, lean proteins - Discussed goal of 150 minutes of moderate intensity physical activity weekly - Recommend patient reestablish care with PCP for follow up - Collaborated with primary care provider office staff to ensure patient has scheduled follow up  Larinda Buttery, PharmD Clinical Pharmacist [facility] [phone]

## 2022-08-20 ENCOUNTER — Encounter: Payer: Managed Care, Other (non HMO) | Admitting: Family Medicine

## 2022-09-19 ENCOUNTER — Encounter: Payer: Self-pay | Admitting: Emergency Medicine

## 2022-09-19 ENCOUNTER — Ambulatory Visit
Admission: EM | Admit: 2022-09-19 | Discharge: 2022-09-19 | Disposition: A | Discharge status: Home / Self Care | Payer: Self-pay | Attending: Family Medicine | Admitting: Family Medicine

## 2022-09-19 DIAGNOSIS — L03311 Cellulitis of abdominal wall: Secondary | ICD-10-CM

## 2022-09-19 DIAGNOSIS — I10 Essential (primary) hypertension: Secondary | ICD-10-CM

## 2022-09-19 MED ORDER — CEPHALEXIN 500 MG PO CAPS: 500.0000 mg | ORAL_CAPSULE | Freq: Three times a day (TID) | ORAL | 0 refills | Status: DC

## 2022-09-19 MED ORDER — ACETAMINOPHEN 325 MG PO TABS
650.0000 mg | ORAL_TABLET | Freq: Once | ORAL | Status: AC
  Administered 2022-09-19: 650 mg via ORAL

## 2022-09-19 MED ORDER — LISINOPRIL 10 MG PO TABS: 10.0000 mg | ORAL_TABLET | Freq: Every day | ORAL | 0 refills | Status: DC

## 2022-09-19 NOTE — ED Triage Notes (Addendum)
Area to left side of abdomen is irritated and painful from where his belt is rubbing x 3 weeks Pt denies warmth or firmness  in the area No drainage per pt  Warm compress Hx of  HTN - no meds currently

## 2022-09-19 NOTE — ED Provider Notes (Signed)
[facility]    CSN: [id number] Arrival date & time: 09/19/22  4098      History   Chief Complaint Chief Complaint  Patient presents with   Abscess    HPI Shawn Richardson is a 42 y.o. male.   HPI  Patient has an infection on his lower abdomen.  His belt rubs against the skin and there is some skin breakdown.  Now he has redness and a large area of his lower abdomen above the pannus crease.  It is also noted that he has elevated blood pressure.  He has known hypertension but has not kept his follow-up appointments with his primary care doctor and therefore has not stayed on medicine.  He was on lisinopril 10 mg a day.  I expressed to him the importance of staying on his blood pressure medicine so he does not develop hypertensive heart disease, heart attack, stroke, and other complications.  He agrees to go back to her primary care doctor so I gave him 30 days of his lisinopril  Past Medical History:  Diagnosis Date   Headaches, cluster    Hypertension    Vertigo     Patient Active Problem List   Diagnosis Date Noted   Abrasion of skin of finger of right hand 12/15/2021   Essential hypertension 11/10/2021   Secondary osteoarthritis of right knee 08/20/2021   Well adult exam 06/18/2021   Depression, major, single episode, moderate (HCC) 06/02/2021   COVID-19 05/22/2021   Pharyngitis 03/03/2021   Achilles tendinosis of right lower extremity 05/23/2020   Hepatic steatosis 12/25/2019   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 difficile diarrhea 01/31/2019    Past Surgical History:  Procedure Laterality Date   KNEE ARTHROSCOPY Right        Home Medications    Prior to Admission medications   Medication Sig Start Date End Date Taking? Authorizing Provider  cephALEXin (KEFLEX) 500 MG capsule Take 1 capsule (500 mg total) by mouth 3 (three) times daily. 09/19/22  Yes Eustace Moore, MD  lisinopril (ZESTRIL) 10 MG tablet  Take 1 tablet (10 mg total) by mouth daily. 09/19/22  Yes Eustace Moore, MD    Family History Family History  Problem Relation Age of Onset   Lung disease Mother    Heart Problems Mother    Dementia Father    Gout Father     Social History Social History   Tobacco Use   Smoking status: Former    Types: Cigarettes    Quit date: 06/25/2022    Years since quitting: 0.2   Smokeless tobacco: Never  Vaping Use   Vaping Use: Never used  Substance Use Topics   Alcohol use: Not Currently   Drug use: Never     Allergies   Patient has no known allergies.   Review of Systems Review of Systems See HPI  Physical Exam Triage Vital Signs ED Triage Vitals  Enc Vitals Group     BP 09/19/22 0918 (!) 144/107     Pulse Rate 09/19/22 0918 (!) 110     Resp 09/19/22 0918 16     Temp 09/19/22 0918 99.1 F (37.3 C)     Temp Source 09/19/22 0918 Oral     SpO2 09/19/22 0918 95 %     Weight 09/19/22 0919 292 lb (132.5 kg)     Height 09/19/22 0919 6' (1.829 m)     Head Circumference --  Peak Flow --      Pain Score 09/19/22 0918 7     Pain Loc --      Pain Edu? --      Excl. in GC? --    No data found.  Updated Vital Signs BP (!) 144/107 (BP Location: Left Arm) Comment: hx of HTN  Pulse (!) 110   Temp 99.1 F (37.3 C) (Oral)   Resp 16   Ht 6' (1.829 m)   Wt 132.5 kg   SpO2 95%   BMI 39.60 kg/m      Physical Exam Constitutional:      General: He is not in acute distress.    Appearance: He is well-developed. He is obese.  HENT:     Head: Normocephalic and atraumatic.  Eyes:     Conjunctiva/sclera: Conjunctivae normal.     Pupils: Pupils are equal, round, and reactive to light.  Cardiovascular:     Rate and Rhythm: Normal rate.  Pulmonary:     Effort: Pulmonary effort is normal. No respiratory distress.  Abdominal:     General: There is no distension.     Palpations: Abdomen is soft.       Comments: Irritated skin in the crease of the pannus, ascending  cellulitis erythematous and warm  Musculoskeletal:        General: Normal range of motion.     Cervical back: Normal range of motion.  Skin:    General: Skin is warm and dry.  Neurological:     Mental Status: He is alert.      UC Treatments / Results  Labs (all labs ordered are listed, but only abnormal results are displayed) Labs Reviewed - No data to display  EKG   Radiology No results found.  Procedures Procedures (including critical care time)  Medications Ordered in UC Medications  acetaminophen (TYLENOL) tablet 650 mg (650 mg Oral Given 09/19/22 0926)    Initial Impression / Assessment and Plan / UC Course  I have reviewed the triage vital signs and the nursing notes.  Pertinent labs & imaging results that were available during my care of the patient were reviewed by me and considered in my medical decision making (see chart for details).     There is no abscess that needing I&D.  I am putting him on Keflex for the cellulitis. As above we discussed his hypertension and he agrees to go for follow-up Final Clinical Impressions(s) / UC Diagnoses   Final diagnoses:  Cellulitis of abdominal wall  Uncontrolled hypertension     Discharge Instructions      Take the antibiotic 3 x a day Use warm compress to area for 20 minutes a couple of times a day  I have refilled your BP medicine.  I gave you one month.  It is important that you make an appointment with primary care for follow up, we cannot refill.   ED Prescriptions     Medication Sig Dispense Auth. Provider   lisinopril (ZESTRIL) 10 MG tablet Take 1 tablet (10 mg total) by mouth daily. 30 tablet Eustace Moore, MD   cephALEXin (KEFLEX) 500 MG capsule Take 1 capsule (500 mg total) by mouth 3 (three) times daily. 21 capsule Eustace Moore, MD      PDMP not reviewed this encounter.   Eustace Moore, MD 09/19/22 [phone]

## 2022-09-19 NOTE — Discharge Instructions (Signed)
Take the antibiotic 3 x a day Use warm compress to area for 20 minutes a couple of times a day  I have refilled your BP medicine.  I gave you one month.  It is important that you make an appointment with primary care for follow up, we cannot refill.

## 2022-10-13 ENCOUNTER — Ambulatory Visit
Admission: EM | Admit: 2022-10-13 | Discharge: 2022-10-13 | Disposition: A | Discharge status: Home / Self Care | Payer: Self-pay | Attending: Emergency Medicine | Admitting: Emergency Medicine

## 2022-10-13 ENCOUNTER — Ambulatory Visit (INDEPENDENT_AMBULATORY_CARE_PROVIDER_SITE_OTHER): Payer: Self-pay

## 2022-10-13 DIAGNOSIS — J029 Acute pharyngitis, unspecified: Secondary | ICD-10-CM

## 2022-10-13 DIAGNOSIS — R6881 Early satiety: Secondary | ICD-10-CM

## 2022-10-13 DIAGNOSIS — R1911 Absent bowel sounds: Secondary | ICD-10-CM

## 2022-10-13 DIAGNOSIS — K5900 Constipation, unspecified: Secondary | ICD-10-CM

## 2022-10-13 LAB — POCT RAPID STREP A (OFFICE): Rapid Strep A Screen: NEGATIVE

## 2022-10-13 NOTE — ED Triage Notes (Signed)
Pt presents to uc with co of nausea, and sore throat for 3 weeks. Pt reports he started getting worse this week and has been vomiting, and loose stools.  Pt reports no otc medications and no sick contacts   Concerned bc they sprayed for bugs in his apartment and he is not sure if that is what is causing the nausea and vomiting.

## 2022-10-13 NOTE — ED Provider Notes (Incomplete)
[facility]    CSN: [id number] Arrival date & time: 10/13/22  1123    HISTORY   Chief Complaint  Patient presents with   Sore Throat   Nausea   Vomiting   HPI Shawn Richardson is a pleasant, 42 y.o. male who presents to urgent care today. Pt presents to uc with co of nausea, and sore throat for 3 weeks. Pt reports he started getting worse this week and has been vomiting, and occasional small amounts of loose stools and still having regular bowel movements.  Pt reports no otc medications and no sick contacts.  Concerned bc they sprayed for bugs in his apartment and he is not sure if that is what is causing the nausea and vomiting.  Patient reports early satiety and frequent vomiting every day for the past 3 weeks.  Patient denies fever, body aches, chills, cough, upper respiratory symptoms.  The history is provided by the patient.   Past Medical History:  Diagnosis Date   Headaches, cluster    Hypertension    Vertigo    Patient Active Problem List   Diagnosis Date Noted   Abrasion of skin of finger of right hand 12/15/2021   Essential hypertension 11/10/2021   Secondary osteoarthritis of right knee 08/20/2021   Well adult exam 06/18/2021   Depression, major, single episode, moderate (HCC) 06/02/2021   COVID-19 05/22/2021   Pharyngitis 03/03/2021   Achilles tendinosis of right lower extremity 05/23/2020   Hepatic steatosis 12/25/2019   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 difficile diarrhea 01/31/2019   Past Surgical History:  Procedure Laterality Date   KNEE ARTHROSCOPY Right     Home Medications    Prior to Admission medications   Medication Sig Start Date End Date Taking? Authorizing Provider  cephALEXin (KEFLEX) 500 MG capsule Take 1 capsule (500 mg total) by mouth 3 (three) times daily. 09/19/22   Eustace Moore, MD  lisinopril (ZESTRIL) 10 MG tablet Take 1 tablet (10 mg total) by mouth daily. 09/19/22    Eustace Moore, MD    Family History Family History  Problem Relation Age of Onset   Lung disease Mother    Heart Problems Mother    Dementia Father    Gout Father    Social History Social History   Tobacco Use   Smoking status: Former    Types: Cigarettes    Quit date: 06/25/2022    Years since quitting: 0.3   Smokeless tobacco: Never  Vaping Use   Vaping Use: Never used  Substance Use Topics   Alcohol use: Not Currently   Drug use: Never   Allergies   Patient has no known allergies.  Review of Systems Review of Systems Pertinent findings revealed after performing a 14 point review of systems has been noted in the history of present illness.  Physical Exam Vital Signs BP (!) 146/97   Pulse (!) 120   Temp 98.2 F (36.8 C)   Resp 16   SpO2 98%   No data found.  Physical Exam Vitals and nursing note reviewed.  Constitutional:      General: He is not in acute distress.    Appearance: Normal appearance. He is normal weight. He is not ill-appearing.  HENT:     Head: Normocephalic and atraumatic.     Salivary Glands: Right salivary gland is not diffusely enlarged or tender. Left salivary gland is not diffusely enlarged or tender.  Right Ear: Hearing, tympanic membrane, ear canal and external ear normal.     Left Ear: Hearing, tympanic membrane, ear canal and external ear normal.     Nose: Nose normal.     Mouth/Throat:     Lips: Pink.     Mouth: Mucous membranes are moist.     Pharynx: Oropharynx is clear. Uvula midline. Posterior oropharyngeal erythema present.  Eyes:     Extraocular Movements: Extraocular movements intact.     Conjunctiva/sclera: Conjunctivae normal.     Pupils: Pupils are equal, round, and reactive to light.  Cardiovascular:     Rate and Rhythm: Normal rate and regular rhythm.  Pulmonary:     Effort: Pulmonary effort is normal.     Breath sounds: Normal breath sounds.  Abdominal:     General: Abdomen is protuberant. Bowel sounds  are absent.     Palpations: Abdomen is rigid.     Tenderness: There is generalized abdominal tenderness.  Musculoskeletal:        General: Normal range of motion.     Cervical back: Full passive range of motion without pain, normal range of motion and neck supple.  Lymphadenopathy:     Cervical: No cervical adenopathy.  Skin:    General: Skin is warm and dry.  Neurological:     General: No focal deficit present.     Mental Status: He is alert and oriented to person, place, and time. Mental status is at baseline.  Psychiatric:        Mood and Affect: Mood normal.        Behavior: Behavior normal.        Thought Content: Thought content normal.        Judgment: Judgment normal.     Visual Acuity Right Eye Distance:   Left Eye Distance:   Bilateral Distance:    Right Eye Near:   Left Eye Near:    Bilateral Near:     UC Couse / Diagnostics / Procedures:   Clinical Course as of 10/13/22 1240  Tue Oct 13, 2022  1225 DG Abd 1 View [LM]    Clinical Course User Index [LM] Theadora Rama Scales, PA-C    Radiology DG Abd 1 View  Result Date: 10/13/2022 CLINICAL DATA:  BS absent, early satiety, n/v x 3 weeks EXAM: ABDOMEN - 1 VIEW COMPARISON:  None Available. FINDINGS: Paucity of small bowel gas slightly limits the ability to assess for bowel obstruction. Within this limitation, no dilated loops of small bowel are visualized. There is likely moderate-to-large colonic stool burden. No pneumatosis. No supine evidence of pneumoperitoneum. No portal venous gas. No acute osseous abnormality. IMPRESSION: Nonobstructive bowel gas pattern. Electronically Signed   By: Lorenza Cambridge M.D.   On: 10/13/2022 12:18    Procedures Procedures (including critical care time) EKG  Pending results:  Labs Reviewed  POCT RAPID STREP A (OFFICE)    Medications Ordered in UC: Medications - No data to display  UC Diagnoses / Final Clinical Impressions(s)   I have reviewed the triage vital signs  and the nursing notes.  Pertinent labs & imaging results that were available during my care of the patient were reviewed by me and considered in my medical decision making (see chart for details).    Final diagnoses:  Constipation, unspecified constipation type   Patient advised of imaging results.  Patient advised to take mag citrate today and repeat tomorrow if needed if first dose does not completely clear his bowels.  Discussed  high-fiber diet, drinking more water and return precautions.  Rapid strep test was negative, throat culture not indicated based to lack of physical exam findings concerning for streptococcal throat infection.  Mild erythema in throat likely due to frequent episodes of vomiting.  Please see discharge instructions below for details of plan of care as provided to patient. ED Prescriptions   None    PDMP not reviewed this encounter.  Pending results:  Labs Reviewed  POCT RAPID STREP A (OFFICE)    Discharge Instructions:   Discharge Instructions      Your abdominal x-ray is concerning for a very large burden of stool.  You appear to be very, very constipated.  This can happen even if you are moving your bowels every day.    It is really easy to become constipated even when you are moving your bowels every day. It only takes one missed opportunity to move your bowels to get you a little backed up.  Unfortunately, we become a little backed up, this can easily become compounded, especially if you are not aware that you have become a little backed up, which for most people is usually the case.  I recommend that you stop by the pharmacy and purchase 2 bottles of magnesium citrate.  This is available over-the-counter and does not require prescription.  I would like for you to consume 1 bottle today which should help you produce a significant amount of stool.    Tomorrow, if you still feel full, are not able to eat a complete meal and feel nauseated, please take  the second bottle.  I have written you a note to be out of work for a few days in case you need it.  If the magnesium citrate does not relieve your symptoms of fullness and nausea or if you begin to experience worsening abdominal pain, recommend that you go to the emergency room for emergency evaluation.  You may have a bowel obstruction which needs to be evaluated soon as possible.  Thank you for visiting [facility] today.  We appreciate the opportunity to participate in her care.      Disposition Upon Discharge:  Condition: stable for discharge home  Patient presented with an acute illness with associated systemic symptoms and significant discomfort requiring urgent management. In my opinion, this is a condition that a prudent lay person (someone who possesses an average knowledge of health and medicine) may potentially expect to result in complications if not addressed urgently such as respiratory distress, impairment of bodily function or dysfunction of bodily organs.   Routine symptom specific, illness specific and/or disease specific instructions were discussed with the patient and/or caregiver at length.   As such, the patient has been evaluated and assessed, work-up was performed and treatment was provided in alignment with urgent care protocols and evidence based medicine.  Patient/parent/caregiver has been advised that the patient may require follow up for further testing and treatment if the symptoms continue in spite of treatment, as clinically indicated and appropriate.  Patient/parent/caregiver has been advised to return to the [facility] or PCP if no better; to PCP or the Emergency Department if new signs and symptoms develop, or if the current signs or symptoms continue to change or worsen for further workup, evaluation and treatment as clinically indicated and appropriate  The patient will follow up with their current PCP if and as advised. If the patient does not  currently have a PCP we will assist them in obtaining one.  The patient may need specialty follow up if the symptoms continue, in spite of conservative treatment and management, for further workup, evaluation, consultation and treatment as clinically indicated and appropriate.  Patient/parent/caregiver verbalized understanding and agreement of plan as discussed.  All questions were addressed during visit.  Please see discharge instructions below for further details of plan.  This office note has been dictated using Teaching laboratory technician.  Unfortunately, this method of dictation can sometimes lead to typographical or grammatical errors.  I apologize for your inconvenience in advance if this occurs.  Please do not hesitate to reach out to me if clarification is needed.      Theadora Rama Scales, PA-C 10/14/22 1144    Theadora Rama Scales, PA-C 10/14/22 1145

## 2022-10-13 NOTE — Discharge Instructions (Addendum)
Your abdominal x-ray is concerning for a very large burden of stool.  You appear to be very, very constipated.  This can happen even if you are moving your bowels every day.    It is really easy to become constipated even when you are moving your bowels every day. It only takes one missed opportunity to move your bowels to get you a little backed up.  Unfortunately, we become a little backed up, this can easily become compounded, especially if you are not aware that you have become a little backed up, which for most people is usually the case.  I recommend that you stop by the pharmacy and purchase 2 bottles of magnesium citrate.  This is available over-the-counter and does not require prescription.  I would like for you to consume 1 bottle today which should help you produce a significant amount of stool.    Tomorrow, if you still feel full, are not able to eat a complete meal and feel nauseated, please take the second bottle.  I have written you a note to be out of work for a few days in case you need it.  If the magnesium citrate does not relieve your symptoms of fullness and nausea or if you begin to experience worsening abdominal pain, recommend that you go to the emergency room for emergency evaluation.  You may have a bowel obstruction which needs to be evaluated soon as possible.  Thank you for visiting [facility] today.  We appreciate the opportunity to participate in her care.

## 2022-10-28 ENCOUNTER — Encounter: Payer: Self-pay | Admitting: Emergency Medicine

## 2022-10-28 ENCOUNTER — Ambulatory Visit: Payer: No Typology Code available for payment source

## 2022-10-28 ENCOUNTER — Ambulatory Visit
Admission: EM | Admit: 2022-10-28 | Discharge: 2022-10-28 | Disposition: A | Discharge status: Home / Self Care | Payer: No Typology Code available for payment source | Attending: Family Medicine | Admitting: Family Medicine

## 2022-10-28 DIAGNOSIS — R1084 Generalized abdominal pain: Secondary | ICD-10-CM

## 2022-10-28 DIAGNOSIS — R110 Nausea: Secondary | ICD-10-CM

## 2022-10-28 DIAGNOSIS — R140 Abdominal distension (gaseous): Secondary | ICD-10-CM

## 2022-10-28 MED ORDER — IOHEXOL 300 MG/ML  SOLN
100.0000 mL | Freq: Once | INTRAMUSCULAR | Status: AC | PRN
  Administered 2022-10-28: 100 mL via INTRAVENOUS

## 2022-10-28 MED ORDER — ONDANSETRON 8 MG PO TBDP: 8.0000 mg | ORAL_TABLET | Freq: Three times a day (TID) | ORAL | 0 refills | Status: DC | PRN

## 2022-10-28 NOTE — ED Triage Notes (Addendum)
Continued nausea, throat discomfort, abdominal discomfort and bloating over the last 5 weeks. Was here for same complaint 5 weeks ago, says he was told he was constipated then. Reports normal bowel movements, but believes he may still be constipated.   Also reports an irritated and red with "lacerations" to his genital area he wants examined.

## 2022-10-28 NOTE — ED Provider Notes (Signed)
[facility]    CSN: [id number] Arrival date & time: 10/28/22  1156      History   Chief Complaint Chief Complaint  Patient presents with   Nausea    HPI Shawn Richardson is a 42 y.o. male.   HPI 42 year old-male presents with abdominal bloating/discomfort/generalized pain for 5 weeks.  PMH significant for morbid obesity, HTN, and headaches.  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 difficile diarrhea 01/31/2019    Past Surgical History:  Procedure Laterality Date   KNEE ARTHROSCOPY Right        Home Medications    Prior to Admission medications   Medication Sig Start Date End Date Taking? Authorizing Provider  ondansetron (ZOFRAN-ODT) 8 MG disintegrating tablet Take 1 tablet (8 mg total) by mouth every 8 (eight) hours as needed for nausea or vomiting. 10/28/22  Yes Trevor Iha, FNP  lisinopril (ZESTRIL) 10 MG tablet Take 1 tablet (10 mg total) by mouth daily. 09/19/22   Eustace Moore, MD    Family History Family History  Problem Relation Age of Onset   Lung disease Mother    Heart Problems Mother    Dementia Father    Gout Father     Social History Social History   Tobacco Use   Smoking status: Former    Types: Cigarettes    Quit date: 06/25/2022    Years since quitting: 0.3   Smokeless tobacco: Never  Vaping Use   Vaping Use: Never used  Substance Use Topics   Alcohol use: Not Currently    Drug use: Never     Allergies   Patient has no known allergies.   Review of Systems Review of Systems   Physical Exam Triage Vital Signs ED Triage Vitals  Enc Vitals Group     BP      Pulse      Resp      Temp      Temp src      SpO2      Weight      Height      Head Circumference      Peak Flow      Pain Score      Pain Loc      Pain Edu?      Excl. in GC?    No data found.  Updated Vital Signs BP (!) 164/114 (BP Location: Left Arm)   Pulse (!) 106   Temp 97.9 F (36.6 C) (Oral)   Resp 16   SpO2 94%    Physical Exam Vitals and nursing note reviewed.  Constitutional:      Appearance: Normal appearance. He is obese.  HENT:     Head: Normocephalic and atraumatic.     Mouth/Throat:     Mouth: Mucous membranes are moist.     Pharynx: Oropharynx is clear.  Eyes:     Extraocular Movements: Extraocular movements intact.  Conjunctiva/sclera: Conjunctivae normal.     Pupils: Pupils are equal, round, and reactive to light.  Cardiovascular:     Rate and Rhythm: Normal rate and regular rhythm.     Pulses: Normal pulses.     Heart sounds: Normal heart sounds.  Pulmonary:     Effort: Pulmonary effort is normal.     Breath sounds: Normal breath sounds. No wheezing, rhonchi or rales.  Abdominal:     General: Abdomen is flat. Bowel sounds are normal. There is no distension or abdominal bruit.     Palpations: Abdomen is soft. There is no shifting dullness, fluid wave, hepatomegaly, splenomegaly, mass or pulsatile mass.     Tenderness: There is generalized abdominal tenderness. There is no right CVA tenderness, left CVA tenderness, guarding or rebound. Negative signs include Murphy's sign, McBurney's sign and psoas sign.  Musculoskeletal:        General: Normal range of motion.     Cervical back: Normal range of motion and neck supple.  Skin:    General: Skin is warm and dry.  Neurological:     General: No focal deficit present.     Mental Status: He is  alert and oriented to person, place, and time. Mental status is at baseline.  Psychiatric:        Mood and Affect: Mood normal.        Behavior: Behavior normal.      UC Treatments / Results  Labs (all labs ordered are listed, but only abnormal results are displayed) Labs Reviewed  COMPREHENSIVE METABOLIC PANEL  CBC WITH DIFFERENTIAL/PLATELET    EKG   Radiology CT ABDOMEN PELVIS W CONTRAST  Result Date: 10/28/2022 CLINICAL DATA:  Abdominal pain and bloating for 5 weeks. EXAM: CT ABDOMEN AND PELVIS WITH CONTRAST TECHNIQUE: Multidetector CT imaging of the abdomen and pelvis was performed using the standard protocol following bolus administration of intravenous contrast. RADIATION DOSE REDUCTION: This exam was performed according to the departmental dose-optimization program which includes automated exposure control, adjustment of the mA and/or kV according to patient size and/or use of iterative reconstruction technique. CONTRAST:  OMNIPAQUE IOHEXOL 300 MG/ML  SOLN COMPARISON:  CT 01/27/2019 FINDINGS: Lower chest: Lung bases are clear.  No pleural effusions. Hepatobiliary: Diffuse low density throughout the liver. Findings are compatible with steatosis. Normal appearance of the gallbladder without distension. Main portal venous system is patent. No significant biliary dilatation. Pancreas: Unremarkable. No pancreatic ductal dilatation or surrounding inflammatory changes. Spleen: Normal in size without focal abnormality. Adrenals/Urinary Tract: Normal adrenal glands. Normal appearance of both kidneys without hydronephrosis. No suspicious renal lesions. No evidence for kidney stones. Normal urinary bladder. Stomach/Bowel: Normal stomach. Normal appearance of small bowel. No evidence for bowel dilatation or obstruction. No focal bowel inflammation. Normal appendix containing gas. Small amount of stool within the colon. No evidence for colonic distension. Vascular/Lymphatic: No significant  vascular findings are present. No enlarged abdominal or pelvic lymph nodes. Reproductive: Prostate is unremarkable. Other: Negative for free fluid.  Negative for free air. Musculoskeletal: No acute bone abnormality. IMPRESSION: 1. No acute abnormality in the abdomen or pelvis. 2. Hepatic steatosis. Electronically Signed   By: Richarda Overlie M.D.   On: 10/28/2022 13:18    Procedures Procedures (including critical care time)  Medications Ordered in UC Medications - No data to display  Initial Impression / Assessment and Plan / UC Course  I have reviewed the triage vital signs and the nursing notes.  Pertinent labs & imaging results that were  available during my care of the patient were reviewed by me and considered in my medical decision making (see chart for details).     MDM: 1.  Generalized abdominal pain-CT of abdomen and pelvis with contrast revealed above, CBC with differential, CMP ordered.;  2.  Nausea-Rx'd Zofran 8 mg every 8 hours, as needed for nausea. 3.  Abdominal bloating-CT of abdomen and pelvis with contrast revealed above, including hepatic steatosis.  Discussed with patient given current obesity and HTN these are typical comorbidities that are involved with hepatic steatosis. Advised patient of today's CT with IV contrast results with hardcopy provided to patient.  Advised we will follow-up with lab results once received.  Advised may use Zofran daily or as needed for nausea.  Advised patient if symptoms worsen and/or unresolved please follow-up with PCP or GI.  [place] and gap contact information provided to patient on this AVS. patient advised/encouraged to reestablish with [place] [facility] care at [facility] for baseline fasting labs and annual physical.  Patient discharged home, hemodynamically stable. Final Clinical Impressions(s) / UC Diagnoses   Final diagnoses:  Abdominal bloating  Generalized abdominal pain  Nausea     Discharge Instructions       Advised patient of today's CT with IV contrast results with hardcopy provided to patient.  Advised we will follow-up with lab results once received.  Advised may use Zofran daily or as needed for nausea.  Advised patient if symptoms worsen and/or unresolved please follow-up with PCP or GI.  [facility] and gap contact information provided to patient on this AVS. patient advised/encouraged to reestablish with [place] [facility] at [facility] for baseline fasting labs and annual physical.     ED Prescriptions     Medication Sig Dispense Auth. Provider   ondansetron (ZOFRAN-ODT) 8 MG disintegrating tablet Take 1 tablet (8 mg total) by mouth every 8 (eight) hours as needed for nausea or vomiting. 24 tablet Trevor Iha, FNP      PDMP not reviewed this encounter.   Trevor Iha, FNP 10/28/22 1359

## 2022-10-28 NOTE — Discharge Instructions (Addendum)
Advised patient of today's CT with IV contrast results with hardcopy provided to patient.  Advised we will follow-up with lab results once received.  Advised may use Zofran daily or as needed for nausea.  Advised patient if symptoms worsen and/or unresolved please follow-up with PCP or GI.  [facility] and gap contact information provided to patient on this AVS. patient advised/encouraged to reestablish with [place] [facility] at [facility] for baseline fasting labs and annual physical.

## 2022-10-29 LAB — CBC WITH DIFFERENTIAL/PLATELET
Basophils Absolute: 0 10*3/uL (ref 0.0–0.2)
Basos: 0 %
EOS (ABSOLUTE): 0 10*3/uL (ref 0.0–0.4)
Eos: 1 %
Hematocrit: 44.5 % (ref 37.5–51.0)
Hemoglobin: 14.2 g/dL (ref 13.0–17.7)
Immature Grans (Abs): 0 10*3/uL (ref 0.0–0.1)
Immature Granulocytes: 0 %
Lymphocytes Absolute: 1.4 10*3/uL (ref 0.7–3.1)
Lymphs: 23 %
MCH: 29.1 pg (ref 26.6–33.0)
MCHC: 31.9 g/dL (ref 31.5–35.7)
MCV: 91 fL (ref 79–97)
Monocytes Absolute: 0.5 10*3/uL (ref 0.1–0.9)
Monocytes: 8 %
Neutrophils Absolute: 4 10*3/uL (ref 1.4–7.0)
Neutrophils: 68 %
Platelets: 262 10*3/uL (ref 150–450)
RBC: 4.88 x10E6/uL (ref 4.14–5.80)
RDW: 11.7 % (ref 11.6–15.4)
WBC: 5.9 10*3/uL (ref 3.4–10.8)

## 2022-10-29 LAB — COMPREHENSIVE METABOLIC PANEL
ALT: 81 IU/L — ABNORMAL HIGH (ref 0–44)
AST: 51 IU/L — ABNORMAL HIGH (ref 0–40)
Albumin/Globulin Ratio: 1.3 (ref 1.2–2.2)
Albumin: 4.1 g/dL (ref 4.1–5.1)
Alkaline Phosphatase: 90 IU/L (ref 44–121)
BUN/Creatinine Ratio: 8 — ABNORMAL LOW (ref 9–20)
BUN: 10 mg/dL (ref 6–24)
Bilirubin Total: 0.7 mg/dL (ref 0.0–1.2)
CO2: 20 mmol/L (ref 20–29)
Calcium: 9.4 mg/dL (ref 8.7–10.2)
Chloride: 97 mmol/L (ref 96–106)
Creatinine, Ser: 1.26 mg/dL (ref 0.76–1.27)
Globulin, Total: 3.2 g/dL (ref 1.5–4.5)
Glucose: 363 mg/dL — ABNORMAL HIGH (ref 70–99)
Potassium: 4.2 mmol/L (ref 3.5–5.2)
Sodium: 132 mmol/L — ABNORMAL LOW (ref 134–144)
Total Protein: 7.3 g/dL (ref 6.0–8.5)
eGFR: 73 mL/min/{1.73_m2} (ref >59)

## 2022-11-02 ENCOUNTER — Ambulatory Visit: Payer: No Typology Code available for payment source | Admitting: Family Medicine

## 2022-11-02 ENCOUNTER — Encounter: Payer: Self-pay | Admitting: Family Medicine

## 2022-11-02 VITALS — BP 142/90 | HR 96 | Temp 97.5°F | Resp 18 | Ht 72.0 in | Wt 282.0 lb

## 2022-11-02 DIAGNOSIS — E119 Type 2 diabetes mellitus without complications: Secondary | ICD-10-CM | POA: Diagnosis not present

## 2022-11-02 DIAGNOSIS — I10 Essential (primary) hypertension: Secondary | ICD-10-CM | POA: Diagnosis not present

## 2022-11-02 DIAGNOSIS — Z7689 Persons encountering health services in other specified circumstances: Secondary | ICD-10-CM | POA: Diagnosis not present

## 2022-11-02 DIAGNOSIS — L0291 Cutaneous abscess, unspecified: Secondary | ICD-10-CM

## 2022-11-02 DIAGNOSIS — R7302 Impaired glucose tolerance (oral): Secondary | ICD-10-CM

## 2022-11-02 LAB — POCT GLYCOSYLATED HEMOGLOBIN (HGB A1C): Hemoglobin A1C: 14 % — AB (ref 4.0–5.6)

## 2022-11-02 MED ORDER — ACCU-CHEK SOFTCLIX LANCETS MISC
12 refills | Status: DC
Start: 2022-11-02 — End: 2022-11-03

## 2022-11-02 MED ORDER — METFORMIN HCL ER 500 MG PO TB24
500.0000 mg | ORAL_TABLET | Freq: Two times a day (BID) | ORAL | 1 refills | Status: DC
Start: 2022-11-02 — End: 2022-11-03

## 2022-11-02 MED ORDER — FREESTYLE TEST VI STRP
ORAL_STRIP | 12 refills | Status: DC
Start: 2022-11-02 — End: 2022-11-03

## 2022-11-02 MED ORDER — BLOOD GLUCOSE MONITORING SUPPL DEVI
0 refills | Status: DC
Start: 2022-11-02 — End: 2022-11-03

## 2022-11-02 MED ORDER — DOXYCYCLINE HYCLATE 100 MG PO TABS
100.0000 mg | ORAL_TABLET | Freq: Two times a day (BID) | ORAL | 0 refills | Status: AC
Start: 2022-11-02 — End: 2022-11-09

## 2022-11-02 MED ORDER — LISINOPRIL 10 MG PO TABS
10.0000 mg | ORAL_TABLET | Freq: Every day | ORAL | 1 refills | Status: DC
Start: 2022-11-02 — End: 2022-11-03

## 2022-11-02 NOTE — Progress Notes (Addendum)
New Patient Office Visit  Subjective    Patient ID: Shawn Richardson, male    DOB: 1981-03-11  Age: 42 y.o. MRN: [id number]  CC:  Chief Complaint  Patient presents with   Establish Care    Patient is here to establish care with new PCP, he states that he does have one concern with a rash on his stomach from his belt    HPI LEIF LOFLIN presents to establish care. Pt is new to me.  Pt reported having nausea along with dry throat. He also reported abdominal pains. He went to urgent care on 10/28/22. He had labs done and showed glucose levels of 363. Last A1c in 2023 4.8. He reports he has been feeling bad like this with nausea since March 2024. He still has dry mouth, denies visual disturbances.  He has hx of HTN and was on Lisinopril 10mg  daily. He reports he stopped it in the last 2 weeks due to feeling bad.  He denies family hx of diabetes.  He reports when he puts on his belt, it has irritation of his skin near his belly button. Been present in the last 3 weeks.   Outpatient Encounter Medications as of 11/02/2022  Medication Sig   Accu-Chek Softclix Lancets lancets Check sugars BID Dx E11.9   Blood Glucose Monitoring Suppl DEVI Check sugars BID Dx E11.9   doxycycline (VIBRA-TABS) 100 MG tablet Take 1 tablet (100 mg total) by mouth 2 (two) times daily for 7 days.   glucose blood (FREESTYLE TEST STRIPS) test strip Use as instructed   metFORMIN (GLUCOPHAGE-XR) 500 MG 24 hr tablet Take 1 tablet (500 mg total) by mouth 2 (two) times daily with a meal.   lisinopril (ZESTRIL) 10 MG tablet Take 1 tablet (10 mg total) by mouth daily.   ondansetron (ZOFRAN-ODT) 8 MG disintegrating tablet Take 1 tablet (8 mg total) by mouth every 8 (eight) hours as needed for nausea or vomiting.   [DISCONTINUED] lisinopril (ZESTRIL) 10 MG tablet Take 1 tablet (10 mg total) by mouth daily.   No facility-administered encounter medications on file as of 11/02/2022.    Past Medical History:  Diagnosis Date    Headaches, cluster    Hypertension    Vertigo     Past Surgical History:  Procedure Laterality Date   KNEE ARTHROSCOPY Right     Family History  Problem Relation Age of Onset   Lung disease Mother    Heart Problems Mother    Dementia Father    Gout Father     Social History   Socioeconomic History   Marital status: Single    Spouse name: Not on file   Number of children: Not on file   Years of education: Not on file   Highest education level: Not on file  Occupational History   Not on file  Tobacco Use   Smoking status: Former    Types: Cigarettes    Quit date: 06/25/2022    Years since quitting: 0.3    Passive exposure: Past   Smokeless tobacco: Never  Vaping Use   Vaping Use: Never used  Substance and Sexual Activity   Alcohol use: Not Currently    Comment: rarely   Drug use: Never   Sexual activity: Yes  Other Topics Concern   Not on file  Social History Narrative   Left handed   Caffeine~ 1-2 cups per day   Lives at home with girlfriend    Social Determinants of Health  Financial Resource Strain: Not on file  Food Insecurity: Not on file  Transportation Needs: Not on file  Physical Activity: Not on file  Stress: Not on file  Social Connections: Not on file  Intimate Partner Violence: Not on file    Review of Systems  Constitutional:  Positive for malaise/fatigue.  HENT:         Dry mouth  Gastrointestinal:  Positive for nausea.  Skin:  Positive for rash.       Sore to abdomen  All other systems reviewed and are negative.       Objective    BP (!) 142/90   Pulse 96   Temp (!) 97.5 F (36.4 C) (Oral)   Resp 18   Ht 6' (1.829 m)   Wt 282 lb (127.9 kg)   SpO2 96%   BMI 38.25 kg/m   Physical Exam Vitals and nursing note reviewed.  Constitutional:      Appearance: He is obese.  HENT:     Head: Normocephalic and atraumatic.     Right Ear: External ear normal.     Left Ear: External ear normal.     Nose: Nose normal.      Mouth/Throat:     Mouth: Mucous membranes are moist.  Cardiovascular:     Rate and Rhythm: Normal rate and regular rhythm.     Pulses: Normal pulses.     Heart sounds: Normal heart sounds.  Pulmonary:     Effort: Pulmonary effort is normal.     Breath sounds: Normal breath sounds.  Abdominal:     General: Abdomen is flat.  Musculoskeletal:        General: Normal range of motion.     Cervical back: Normal range of motion.  Skin:    General: Skin is warm.     Capillary Refill: Capillary refill takes less than 2 seconds.     Findings: Erythema present.     Comments: Erythema and induration to abdomen measuring 1x1cm  Neurological:     General: No focal deficit present.     Mental Status: He is alert and oriented to person, place, and time. Mental status is at baseline.  Psychiatric:        Mood and Affect: Mood normal.        Behavior: Behavior normal.        Thought Content: Thought content normal.        Judgment: Judgment normal.        Assessment & Plan:   Encounter to establish care with new doctor  Impaired glucose tolerance -     POCT glycosylated hemoglobin (Hb A1C)  New onset type 2 diabetes mellitus (HCC) -     Doxycycline Hyclate; Take 1 tablet (100 mg total) by mouth 2 (two) times daily for 7 days.  Dispense: 14 tablet; Refill: 0 -     metFORMIN HCl ER; Take 1 tablet (500 mg total) by mouth 2 (two) times daily with a meal.  Dispense: 180 tablet; Refill: 1 -     Comprehensive metabolic panel -     Lipid panel -     Microalbumin / creatinine urine ratio -     FreeStyle Test; Use as instructed  Dispense: 100 each; Refill: 12 -     Accu-Chek Softclix Lancets; Check sugars BID Dx E11.9  Dispense: 100 each; Refill: 12 -     Blood Glucose Monitoring Suppl; Check sugars BID Dx E11.9  Dispense: 1 m; Refill: 0 -  Lisinopril; Take 1 tablet (10 mg total) by mouth daily.  Dispense: 90 tablet; Refill: 1  Cutaneous abscess, unspecified site  Essential hypertension -      Lisinopril; Take 1 tablet (10 mg total) by mouth daily.  Dispense: 90 tablet; Refill: 1    Lab Results  Component Value Date   HGBA1C 14.0 (A) 11/02/2022   A1c today more than 14. Newly diagnosed diabetes Start Metformin 500mg  BID. Sent meter, strips and lancets to check BID. Record on log and bring back in 4 weeks.  Continue Lisinopril 10mg  for HTN and kidney protection. Doxycycline for abscess to abdomen.     Return in about 4 weeks (around 11/30/2022) for Diabetes.   Suzan Slick, MD

## 2022-11-03 ENCOUNTER — Encounter: Payer: Self-pay | Admitting: Family Medicine

## 2022-11-03 ENCOUNTER — Other Ambulatory Visit: Payer: Self-pay | Admitting: Family Medicine

## 2022-11-03 DIAGNOSIS — E782 Mixed hyperlipidemia: Secondary | ICD-10-CM

## 2022-11-03 DIAGNOSIS — I10 Essential (primary) hypertension: Secondary | ICD-10-CM

## 2022-11-03 DIAGNOSIS — E119 Type 2 diabetes mellitus without complications: Secondary | ICD-10-CM

## 2022-11-03 LAB — COMPREHENSIVE METABOLIC PANEL
ALT: 87 IU/L — ABNORMAL HIGH (ref 0–44)
AST: 70 IU/L — ABNORMAL HIGH (ref 0–40)
Albumin/Globulin Ratio: 1.3
Albumin: 4.4 g/dL (ref 4.1–5.1)
Alkaline Phosphatase: 104 IU/L (ref 44–121)
BUN/Creatinine Ratio: 7 — ABNORMAL LOW (ref 9–20)
BUN: 8 mg/dL (ref 6–24)
Bilirubin Total: 0.6 mg/dL (ref 0.0–1.2)
CO2: 20 mmol/L (ref 20–29)
Calcium: 10.3 mg/dL — ABNORMAL HIGH (ref 8.7–10.2)
Chloride: 98 mmol/L (ref 96–106)
Creatinine, Ser: 1.16 mg/dL (ref 0.76–1.27)
Globulin, Total: 3.4 g/dL (ref 1.5–4.5)
Glucose: 314 mg/dL — ABNORMAL HIGH (ref 70–99)
Potassium: 4.9 mmol/L (ref 3.5–5.2)
Sodium: 136 mmol/L (ref 134–144)
Total Protein: 7.8 g/dL (ref 6.0–8.5)
eGFR: 81 mL/min/{1.73_m2} (ref >59)

## 2022-11-03 LAB — LIPID PANEL
Chol/HDL Ratio: 8.2 ratio — ABNORMAL HIGH (ref 0.0–5.0)
Cholesterol, Total: 263 mg/dL — ABNORMAL HIGH (ref 100–199)
HDL: 32 mg/dL — ABNORMAL LOW (ref >39)
LDL Chol Calc (NIH): 184 mg/dL — ABNORMAL HIGH (ref 0–99)
Triglycerides: 245 mg/dL — ABNORMAL HIGH (ref 0–149)
VLDL Cholesterol Cal: 47 mg/dL — ABNORMAL HIGH (ref 5–40)

## 2022-11-03 LAB — MICROALBUMIN / CREATININE URINE RATIO
Creatinine, Urine: 109.8 mg/dL
Microalb/Creat Ratio: 37 mg/g creat — ABNORMAL HIGH (ref 0–29)
Microalbumin, Urine: 40.9 ug/mL

## 2022-11-03 MED ORDER — FREESTYLE TEST VI STRP
ORAL_STRIP | 12 refills | Status: AC
Start: 2022-11-03 — End: ?

## 2022-11-03 MED ORDER — ACCU-CHEK SOFTCLIX LANCETS MISC: 12 refills | Status: DC

## 2022-11-03 MED ORDER — LISINOPRIL 10 MG PO TABS
10.0000 mg | ORAL_TABLET | Freq: Every day | ORAL | 1 refills | Status: AC
Start: 2022-11-03 — End: ?

## 2022-11-03 MED ORDER — ATORVASTATIN CALCIUM 10 MG PO TABS: 10.0000 mg | ORAL_TABLET | Freq: Every evening | ORAL | 3 refills | Status: DC

## 2022-11-03 MED ORDER — METFORMIN HCL ER 500 MG PO TB24
500.0000 mg | ORAL_TABLET | Freq: Two times a day (BID) | ORAL | 1 refills | Status: AC
Start: 2022-11-03 — End: ?

## 2022-11-03 MED ORDER — METFORMIN HCL ER 500 MG PO TB24
500.0000 mg | ORAL_TABLET | Freq: Two times a day (BID) | ORAL | 1 refills | Status: DC
Start: 2022-11-03 — End: 2022-11-03

## 2022-11-03 MED ORDER — BLOOD GLUCOSE MONITORING SUPPL DEVI
0 refills | Status: AC
Start: 2022-11-03 — End: ?

## 2022-11-03 MED ORDER — LISINOPRIL 10 MG PO TABS
10.0000 mg | ORAL_TABLET | Freq: Every day | ORAL | 1 refills | Status: DC
Start: 2022-11-03 — End: 2022-11-03

## 2022-11-03 MED ORDER — ATORVASTATIN CALCIUM 10 MG PO TABS
10.0000 mg | ORAL_TABLET | Freq: Every evening | ORAL | 3 refills | Status: AC
Start: 2022-11-03 — End: ?

## 2022-11-03 MED ORDER — ATORVASTATIN CALCIUM 10 MG PO TABS
10.0000 mg | ORAL_TABLET | Freq: Every evening | ORAL | 3 refills | Status: DC
Start: 2022-11-03 — End: 2022-11-03

## 2022-11-03 MED ORDER — BLOOD GLUCOSE MONITORING SUPPL DEVI
0 refills | Status: DC
Start: 2022-11-03 — End: 2022-11-03

## 2022-11-03 MED ORDER — ACCU-CHEK SOFTCLIX LANCETS MISC
12 refills | Status: AC
Start: 2022-11-03 — End: ?

## 2022-11-03 MED ORDER — FREESTYLE TEST VI STRP
ORAL_STRIP | 12 refills | Status: DC
Start: 2022-11-03 — End: 2022-11-03

## 2022-11-10 ENCOUNTER — Telehealth: Payer: Self-pay

## 2022-11-10 NOTE — Telephone Encounter (Signed)
Called patient to verify that her did read lab results from provider, patient states that he did read the message and voiced a verbal understanding

## 2022-11-30 ENCOUNTER — Ambulatory Visit: Payer: No Typology Code available for payment source | Admitting: Family Medicine

## 2023-03-12 ENCOUNTER — Encounter: Payer: Self-pay | Admitting: Family Medicine

## 2023-03-12 ENCOUNTER — Ambulatory Visit: Payer: No Typology Code available for payment source | Admitting: Family Medicine

## 2023-03-12 VITALS — BP 127/81 | HR 87 | Temp 97.7°F | Resp 18 | Ht 72.0 in | Wt 274.0 lb

## 2023-03-12 DIAGNOSIS — H00014 Hordeolum externum left upper eyelid: Secondary | ICD-10-CM | POA: Diagnosis not present

## 2023-03-12 MED ORDER — POLYMYXIN B-TRIMETHOPRIM 10000-0.1 UNIT/ML-% OP SOLN
2.0000 [drp] | Freq: Four times a day (QID) | OPHTHALMIC | 0 refills | Status: AC
Start: 2023-03-12 — End: ?

## 2023-03-12 NOTE — Progress Notes (Signed)
   Acute Office Visit  Subjective:     Patient ID: Shawn Richardson, male    DOB: 08-28-1980, 42 y.o.   MRN: [id number]  Chief Complaint  Patient presents with   Stye    Patient has complaints of a stye on his left eye that he states has been there for a little over a month, he states that it is draining a little and does hurt.     HPI Patient is in today for acute visit.  He reports a stye to his left upper eyelid. Been present for the last month. Painful to touch. Hasn't tried anything for the stye at home.   Review of Systems  HENT:         Left eyelid bump  All other systems reviewed and are negative.      Objective:    BP 127/81   Pulse 87   Temp 97.7 F (36.5 C) (Oral)   Resp 18   Ht 6' (1.829 m)   Wt 274 lb (124.3 kg)   SpO2 97%   BMI 37.16 kg/m    Physical Exam Vitals and nursing note reviewed.  Constitutional:      Appearance: Normal appearance. He is normal weight.  HENT:     Head: Normocephalic and atraumatic.     Right Ear: External ear normal.     Left Ear: External ear normal.     Nose: Nose normal.     Mouth/Throat:     Mouth: Mucous membranes are moist.     Pharynx: Oropharynx is clear.  Eyes:     Conjunctiva/sclera: Conjunctivae normal.     Pupils: Pupils are equal, round, and reactive to light.     Comments: Soft tissue cyst to left upper eyelid  Cardiovascular:     Rate and Rhythm: Normal rate.  Pulmonary:     Effort: Pulmonary effort is normal.  Skin:    General: Skin is warm.     Capillary Refill: Capillary refill takes less than 2 seconds.  Neurological:     General: No focal deficit present.     Mental Status: He is alert and oriented to person, place, and time. Mental status is at baseline.  Psychiatric:        Mood and Affect: Mood normal.        Behavior: Behavior normal.        Thought Content: Thought content normal.        Judgment: Judgment normal.   No results found for any visits on 03/12/23.      Assessment &  Plan:   Problem List Items Addressed This Visit   None Hordeolum externum of left upper eyelid -     Polymyxin B-Trimethoprim; Place 2 drops into the left eye every 6 (six) hours.  Dispense: 10 mL; Refill: 0   Discussed treatment care at home for pt to do such as warm compresses to the eye along with adding antibiotic drop to the eye 4 times a day.  Follow up prn if not better Plan to see in 6 weeks for routine chronic condition management.   No orders of the defined types were placed in this encounter.   No follow-ups on file.  Suzan Slick, MD

## 2023-03-18 ENCOUNTER — Encounter: Payer: Self-pay | Admitting: Family Medicine

## 2023-03-25 ENCOUNTER — Encounter: Payer: Self-pay | Admitting: Family Medicine

## 2023-03-25 ENCOUNTER — Ambulatory Visit (INDEPENDENT_AMBULATORY_CARE_PROVIDER_SITE_OTHER): Payer: No Typology Code available for payment source | Admitting: Family Medicine

## 2023-03-25 VITALS — BP 127/84 | HR 74 | Temp 97.3°F | Resp 18 | Ht 72.0 in | Wt 275.3 lb

## 2023-03-25 DIAGNOSIS — Z0100 Encounter for examination of eyes and vision without abnormal findings: Secondary | ICD-10-CM

## 2023-03-25 DIAGNOSIS — H00014 Hordeolum externum left upper eyelid: Secondary | ICD-10-CM | POA: Insufficient documentation

## 2023-03-25 MED ORDER — CEPHALEXIN 500 MG PO CAPS
500.0000 mg | ORAL_CAPSULE | Freq: Two times a day (BID) | ORAL | 0 refills | Status: AC
Start: 2023-03-25 — End: ?

## 2023-03-25 NOTE — Assessment & Plan Note (Addendum)
Treatment started per PCP on 10/18 with no improvement in symptoms. Keflex 500 mg BID given to cover skin infection. Continue warm compresses. Referral placed to opthalmology for evaluation and routine eye exam. Follow-up with PCP after completing antibiotic if not resolved.

## 2023-03-25 NOTE — Patient Instructions (Signed)
You were prescribed an antibiotic today. Please complete the full course.  Acetaminophen or ibuprofen for fever according to package instructions, do not exceed recommended doses.  Adequate fluids to avoid dehydration. Lots of rest while you are recovering.  Follow-up if your symptoms do not improve.     

## 2023-03-25 NOTE — Progress Notes (Signed)
   Established Patient Office Visit  Subjective   Patient ID: Shawn Richardson, male    DOB: 10-Jul-1980  Age: 42 y.o. MRN: [id number]  Chief Complaint  Patient presents with   Medical Management of Chronic Issues    Patient is here to follow up on the stye on his eye, patient states that it is not getting any better    HPI  Presents today for an acute visit with complaint of stye that has not improved since starting treatment on 10/18.  Symptoms have been present  for over one month.  Associated symptoms include: pain has improved.  Pertinent negatives: no fever Pain severity: 0/10  Treatments tried include : polymyxin B- Trimethoprim drops  Treatment effective : some improvement, not resolved.  Sick contacts : n/a   Chart review: Saw PCP on 10/18: stye present for one month. Polymyxin B-Trimethoprim drops given. Told to follow-up if not improved.    Review of Systems  Constitutional:  Negative for chills and fever.  Eyes:  Negative for blurred vision and double vision.      Objective:     BP 127/84   Pulse 74   Temp (!) 97.3 F (36.3 C) (Oral)   Resp 18   Ht 6' (1.829 m)   Wt 275 lb 4.8 oz (124.9 kg)   SpO2 97%   BMI 37.34 kg/m  BP Readings from Last 3 Encounters:  03/25/23 127/84  03/12/23 127/81  11/02/22 (!) 142/90      Physical Exam Vitals and nursing note reviewed.  Constitutional:      General: He is not in acute distress.    Appearance: Normal appearance.  Eyes:     Conjunctiva/sclera:     Right eye: Right conjunctiva is not injected. No chemosis or exudate.    Left eye: Left conjunctiva is not injected. No chemosis or exudate.    Comments: Left eye lid with firm nodule present.   Neurological:     Mental Status: He is alert.      No results found for any visits on 03/25/23.    The 10-year ASCVD risk score (Arnett DK, et al., 2019) is: 13.7%    Assessment & Plan:   Problem List Items Addressed This Visit     Encounter for vision  screening    Referral placed to opthalmology.       Relevant Orders   Ambulatory referral to Ophthalmology   Hordeolum externum of left upper eyelid - Primary    Treatment started per PCP on 10/18 with no improvement in symptoms. Keflex 500 mg BID given to cover skin infection. Continue warm compresses. Referral placed to opthalmology for evaluation and routine eye exam. Follow-up with PCP after completing antibiotic if not resolved.       Relevant Medications   cephALEXin (KEFLEX) 500 MG capsule   Other Relevant Orders   Ambulatory referral to Ophthalmology  Agrees with plan of care discussed.  Questions answered.   Return if symptoms worsen or fail to improve.    Novella Olive, FNP

## 2023-03-25 NOTE — Assessment & Plan Note (Signed)
Referral placed to opthalmology

## 2023-04-27 ENCOUNTER — Ambulatory Visit: Payer: No Typology Code available for payment source | Admitting: Family Medicine

## 2023-06-07 ENCOUNTER — Encounter: Payer: Self-pay | Admitting: Family Medicine

## 2023-06-07 ENCOUNTER — Ambulatory Visit: Payer: PRIVATE HEALTH INSURANCE | Admitting: Family Medicine

## 2023-06-07 VITALS — BP 130/86 | HR 80 | Temp 97.3°F | Resp 20 | Ht 72.0 in | Wt 273.7 lb

## 2023-06-07 DIAGNOSIS — E782 Mixed hyperlipidemia: Secondary | ICD-10-CM | POA: Diagnosis not present

## 2023-06-07 DIAGNOSIS — Z91148 Patient's other noncompliance with medication regimen for other reason: Secondary | ICD-10-CM | POA: Diagnosis not present

## 2023-06-07 DIAGNOSIS — E3452 Partial androgen insensitivity syndrome: Secondary | ICD-10-CM | POA: Diagnosis not present

## 2023-06-07 DIAGNOSIS — E1165 Type 2 diabetes mellitus with hyperglycemia: Secondary | ICD-10-CM | POA: Diagnosis not present

## 2023-06-07 NOTE — Progress Notes (Signed)
 Established Patient Office Visit  Subjective   Patient ID: Shawn Richardson, male    DOB: [birth year]  Age: 43 y.o. MRN: [id number]  Chief Complaint  Patient presents with   Erectile Dysfunction    Patient is here today to discuss getting medication for erectile dysfunction, He would also like to know how could he getting tested for low sperm count he states he and his significant other is trying to conceive. Patient would also like to discuss his HGA1C.    Erectile Dysfunction   Diabetes Pt not checking sugars. Pt reports he's not taking any medicine Metformin  500mg  BID. He is trying to eat better and cut back on his sugars.   Hyperlipidemia Pt with hx of HLD. Not taking Atorvastatin  10 mg daily.   ED Pt reports he's had ED for the last several years but worsening. He says 'it's not functioning at all'. He reports him and his spouse is trying to conceive and unable to. He would like to see a fertility clinic.    Review of Systems  Genitourinary:        Erectile dysfunction  All other systems reviewed and are negative.     Objective:     BP 130/86   Pulse 80   Temp (!) 97.3 F (36.3 C) (Oral)   Resp 20   Ht 6' (1.829 m)   Wt 273 lb 11.2 oz (124.1 kg)   SpO2 96%   BMI 37.12 kg/m  BP Readings from Last 3 Encounters:  06/07/23 130/86  03/25/23 127/84  03/12/23 127/81      Physical Exam Vitals and nursing note reviewed.  Constitutional:      Appearance: Normal appearance. He is obese.  HENT:     Head: Normocephalic and atraumatic.     Right Ear: External ear normal.     Left Ear: External ear normal.     Nose: Nose normal.     Mouth/Throat:     Mouth: Mucous membranes are moist.     Pharynx: Oropharynx is clear.  Eyes:     Conjunctiva/sclera: Conjunctivae normal.     Pupils: Pupils are equal, round, and reactive to light.  Cardiovascular:     Rate and Rhythm: Normal rate.  Pulmonary:     Effort: Pulmonary effort is normal.  Skin:    General: Skin  is warm.     Capillary Refill: Capillary refill takes less than 2 seconds.  Neurological:     General: No focal deficit present.     Mental Status: He is alert and oriented to person, place, and time. Mental status is at baseline.  Psychiatric:        Mood and Affect: Mood normal.        Behavior: Behavior normal.        Thought Content: Thought content normal.        Judgment: Judgment normal.    No results found for any visits on 06/07/23.  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 Had a long discussion with pt on complications of uncontrolled DM to include kidney failure, strokes and/or heart attacks. Have advised pt to go back on medications prescribed to him for HLD and DM.  Refer to Infertility clinic but also discussed with pt that the reasons for his ED could be from his uncontrolled diabetes, not being treated. Pt voiced understanding and reports he will 'do better'.   No follow-ups on file.    Torrence CINDERELLA Barrier, MD

## 2023-06-08 ENCOUNTER — Encounter: Payer: Self-pay | Admitting: Family Medicine

## 2023-06-08 ENCOUNTER — Other Ambulatory Visit: Payer: Self-pay | Admitting: Family Medicine

## 2023-06-08 DIAGNOSIS — N529 Male erectile dysfunction, unspecified: Secondary | ICD-10-CM

## 2023-06-08 LAB — COMPREHENSIVE METABOLIC PANEL
ALT: 32 [IU]/L (ref 0–44)
AST: 30 [IU]/L (ref 0–40)
Albumin: 4.5 g/dL (ref 4.1–5.1)
Alkaline Phosphatase: 71 [IU]/L (ref 44–121)
BUN/Creatinine Ratio: 11 (ref 9–20)
BUN: 15 mg/dL (ref 6–24)
Bilirubin Total: 0.5 mg/dL (ref 0.0–1.2)
CO2: 26 mmol/L (ref 20–29)
Calcium: 9.4 mg/dL (ref 8.7–10.2)
Chloride: 103 mmol/L (ref 96–106)
Creatinine, Ser: 1.37 mg/dL — ABNORMAL HIGH (ref 0.76–1.27)
Globulin, Total: 3.2 g/dL (ref 1.5–4.5)
Glucose: 108 mg/dL — ABNORMAL HIGH (ref 70–99)
Potassium: 4.5 mmol/L (ref 3.5–5.2)
Sodium: 141 mmol/L (ref 134–144)
Total Protein: 7.7 g/dL (ref 6.0–8.5)
eGFR: 66 mL/min/{1.73_m2} (ref >59)

## 2023-06-08 LAB — LIPID PANEL
Chol/HDL Ratio: 5.2 {ratio} — ABNORMAL HIGH (ref 0.0–5.0)
Cholesterol, Total: 192 mg/dL (ref 100–199)
HDL: 37 mg/dL — ABNORMAL LOW (ref >39)
LDL Chol Calc (NIH): 138 mg/dL — ABNORMAL HIGH (ref 0–99)
Triglycerides: 92 mg/dL (ref 0–149)
VLDL Cholesterol Cal: 17 mg/dL (ref 5–40)

## 2023-06-08 LAB — HEMOGLOBIN A1C
Est. average glucose Bld gHb Est-mCnc: 114 mg/dL
Hgb A1c MFr Bld: 5.6 % (ref 4.8–5.6)

## 2023-06-08 MED ORDER — SILDENAFIL CITRATE 25 MG PO TABS: 25.0000 mg | ORAL_TABLET | Freq: Every day | ORAL | 1 refills | Status: AC | PRN

## 2023-07-15 ENCOUNTER — Ambulatory Visit: Payer: PRIVATE HEALTH INSURANCE | Admitting: Family Medicine

## 2023-08-04 ENCOUNTER — Ambulatory Visit: Payer: PRIVATE HEALTH INSURANCE | Admitting: Family Medicine

## 2023-08-04 ENCOUNTER — Encounter: Payer: Self-pay | Admitting: Family Medicine

## 2023-08-04 VITALS — BP 128/85 | HR 75 | Temp 98.2°F | Resp 18 | Ht 72.0 in | Wt 276.2 lb

## 2023-08-04 DIAGNOSIS — E3452 Partial androgen insensitivity syndrome: Secondary | ICD-10-CM

## 2023-08-04 NOTE — Progress Notes (Signed)
   Acute Office Visit  Subjective:     Patient ID: Shawn Richardson, male    DOB: 20-Dec-1980, 43 y.o.   MRN: [id number]  Chief Complaint  Patient presents with   referral request    Patient is here requesting a referral to a fertility clinic or Urologist. Patient states that he and his wife are trying to conceive     HPI Patient is in today for acute visit.  Pt is here asking for referral for fertility clinic. This was discussed in January and referral was placed. Pt is here and states he never was called. His wife and himself are trying to have a baby.  Review of Systems  All other systems reviewed and are negative.      Objective:    BP 128/85   Pulse 75   Temp 98.2 F (36.8 C) (Oral)   Resp 18   Ht 6' (1.829 m)   Wt 276 lb 3.2 oz (125.3 kg)   SpO2 98%   BMI 37.46 kg/m  BP Readings from Last 3 Encounters:  08/04/23 128/85  06/07/23 130/86  03/25/23 127/84      Physical Exam Vitals and nursing note reviewed.  Constitutional:      Appearance: He is obese.  HENT:     Head: Normocephalic and atraumatic.     Right Ear: External ear normal.     Left Ear: External ear normal.  Pulmonary:     Effort: Pulmonary effort is normal.  Skin:    Capillary Refill: Capillary refill takes less than 2 seconds.  Neurological:     General: No focal deficit present.     Mental Status: He is alert and oriented to person, place, and time. Mental status is at baseline.   No results found for any visits on 08/04/23.      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 Given pt information on referral and clinic location/contact #. Have also touched base with the referral coordinator to make sure referral was received.  No orders of the defined types were placed in this encounter.   No follow-ups on file.  Suzan Slick, MD

## 2023-08-04 NOTE — Patient Instructions (Signed)
 [facility] [address] [place], [place] [address] [phone]

## 2023-09-06 ENCOUNTER — Telehealth: Payer: Self-pay

## 2023-09-06 ENCOUNTER — Ambulatory Visit: Payer: PRIVATE HEALTH INSURANCE | Admitting: Family Medicine

## 2023-09-06 NOTE — Telephone Encounter (Signed)
 Noted and aware  Copied from CRM [id number]. Topic: Appointments - Appointment Cancel/Reschedule >> Sep 06, 2023 10:21 AM Raven B wrote: Patient/patient representative is calling to cancel or reschedule an appointment. Refer to attachments for appointment information.

## 2023-10-25 IMAGING — DX DG KNEE COMPLETE 4+V*R*
4 series · 4 of 4 positions shown · non-contrast
Comparison: None.

CLINICAL DATA: Right knee pain. ACL reconstruction about 20 years
ago. Comparison views of left knee.

EXAM:
RIGHT KNEE - COMPLETE 4+ VIEW; LEFT KNEE - 1-2 VIEW

[knee lat]
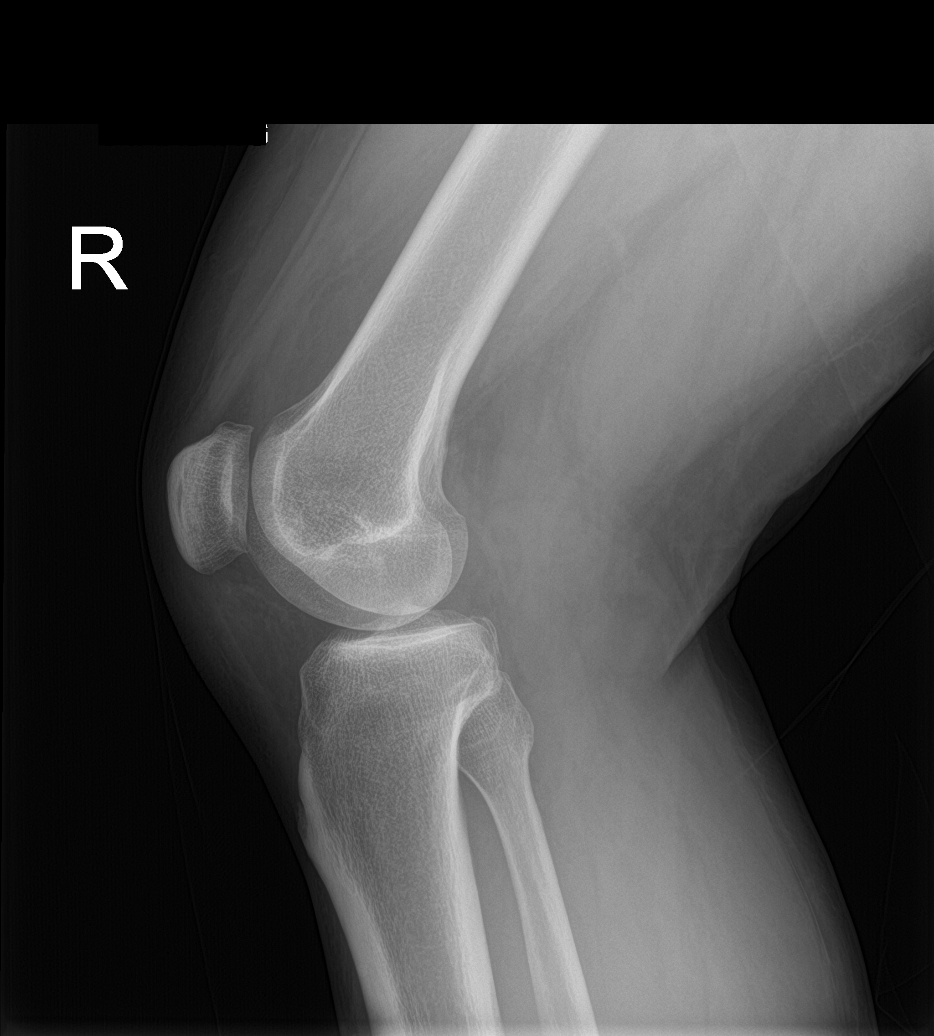

[knee ap bilat standing]
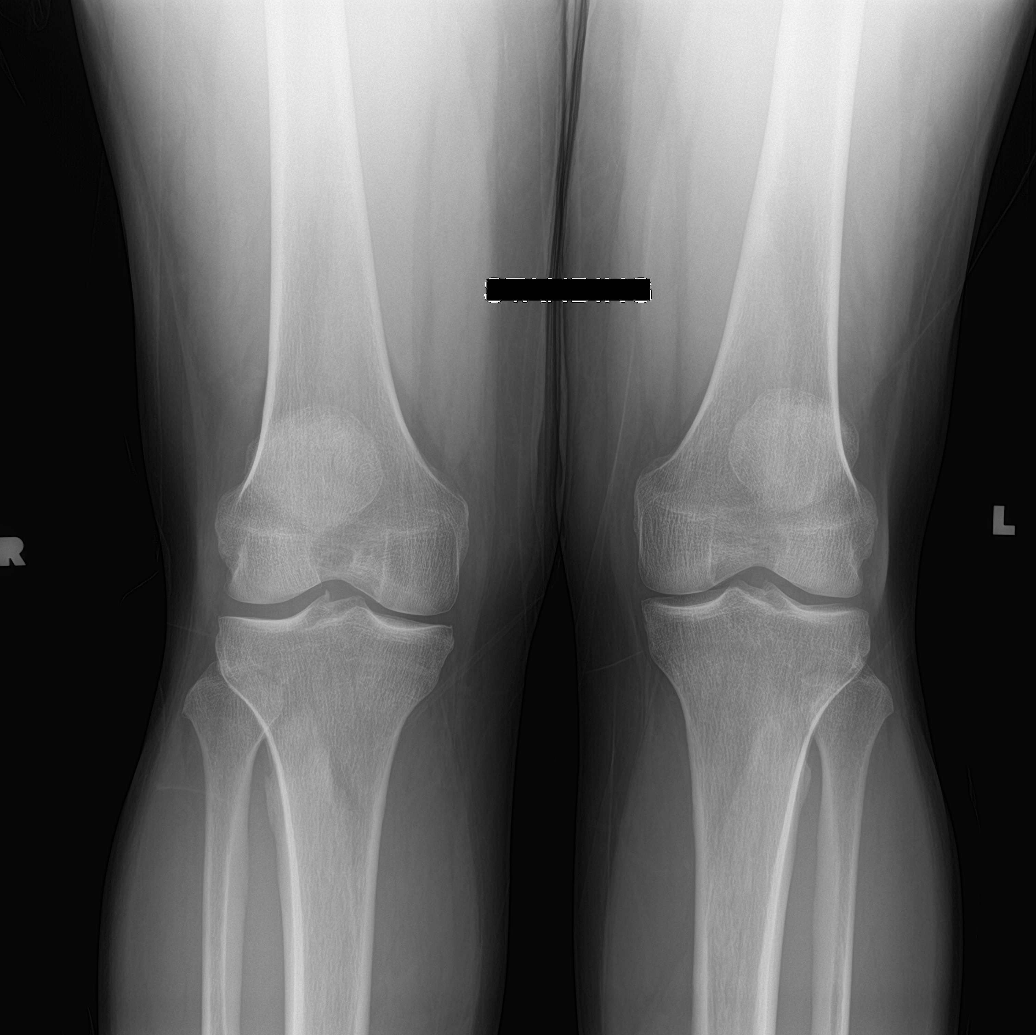

[knee sunrise standing]
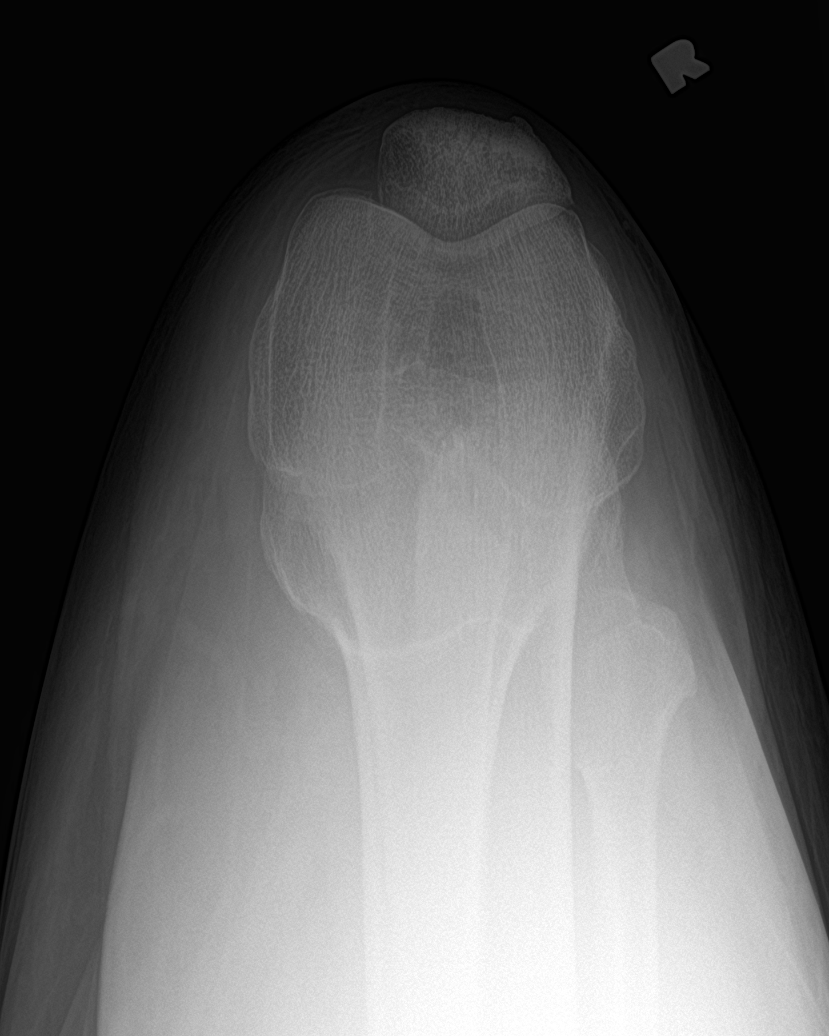

[knee tunnel]
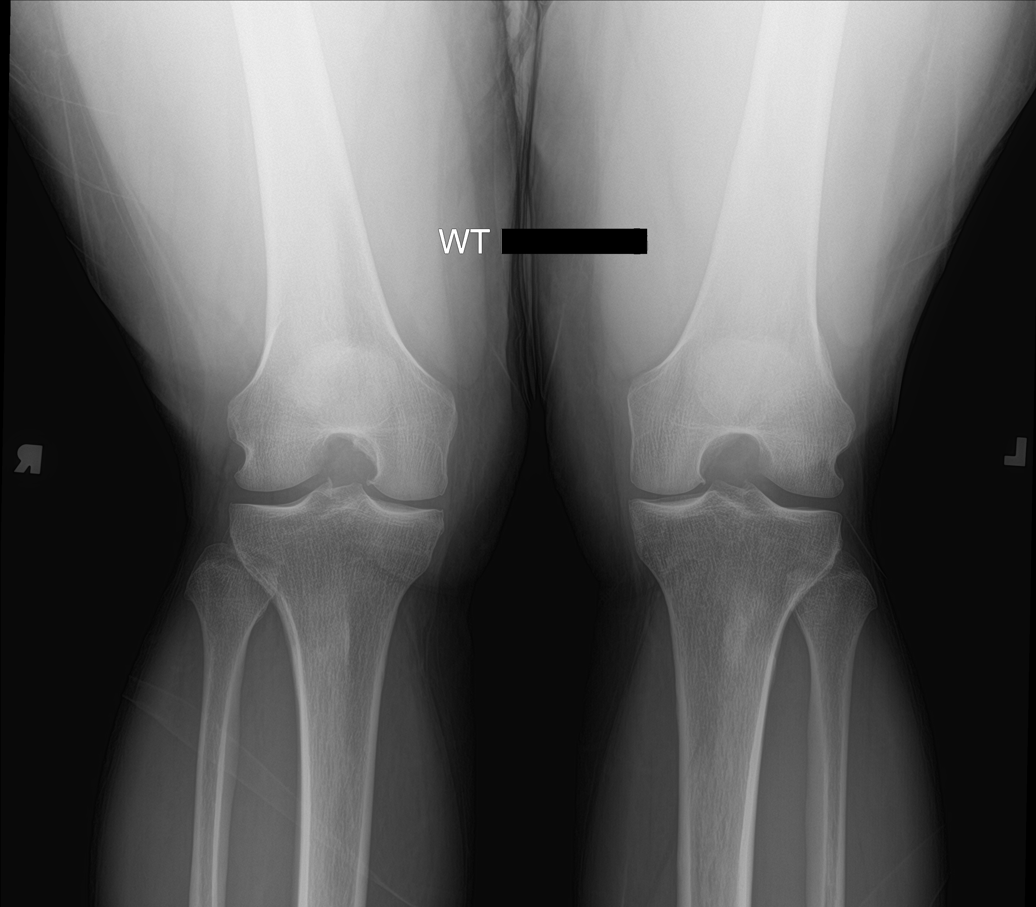

[4 of 4 positions shown; findings below may reference images not displayed]

FINDINGS: No joint effusion. Minimal superior lateral, and inferior patellar
degenerative osteophytes. Minimal peripheral medial compartment
degenerative osteophytosis.

PA and AP views of the left knee are unremarkable.
IMPRESSION: Mild right peripheral patellar degenerative osteophytes.

## 2024-01-07 ENCOUNTER — Ambulatory Visit: Payer: PRIVATE HEALTH INSURANCE | Admitting: Family Medicine

## 2024-01-07 ENCOUNTER — Ambulatory Visit: Payer: Self-pay

## 2024-01-07 ENCOUNTER — Telehealth: Payer: Self-pay | Admitting: Family Medicine

## 2024-01-07 NOTE — Telephone Encounter (Deleted)
 Pt with 3 no shows dated 11/30/22, 09/06/23, and 01/07/24. Dismissed from practice due to breach in no show policy.

## 2024-01-07 NOTE — Telephone Encounter (Signed)
 FYI Only or Action Required?: Action required by provider: request for appointment.  Patient was last seen in primary care on 08/04/2023 by Colette Torrence GRADE, MD.  Called Nurse Triage reporting Arm Pain.  Symptoms began about a month ago.  Interventions attempted: Prescription medications: motrin .  Symptoms are: gradually improving.  Triage Disposition: See PCP Within 2 Weeks  Patient/caregiver understands and will follow disposition?: YesCopied from CRM [id number]. Topic: Clinical - Red Word Triage >> Jan 07, 2024 10:53 AM Donna BRAVO wrote: Red Word that prompted transfer to Nurse Triage: patient has left arm/neck/shoulder pain, going on for 3 weeks, pain is lingering longer   ----------------------------------------------------------------------- From previous Reason for Contact - Scheduling: Patient/patient representative is calling to schedule an appointment. Refer to attachments for appointment information.  Patient calling to reschedule appt 01/07/24 Reason for Disposition  [1] MILD pain (e.g., does not interfere with normal activities) AND [2] present > 7 days  Answer Assessment - Initial Assessment Questions Pt stated he has degenerative disc disease. Pt has pain in neck, left shoulder/arm. Pain is getting better but is lingering. Pt takes 800mg  Motrin  for pain.     1. ONSET: When did the pain start?     1 month 2. LOCATION: Where is the pain located?     Left arm 3. PAIN: How bad is the pain? (Scale 0-10; or none, mild, moderate, severe)     3 4. WORK OR EXERCISE: Has there been any recent work or exercise that involved this part of the body?     na 5. CAUSE: What do you think is causing the arm pain?     Degenerative neck disease 6. OTHER SYMPTOMS: Do you have any other symptoms? (e.g., neck pain, swelling, rash, fever, numbness, weakness)     denies  Protocols used: Arm Pain-A-AH

## 2024-01-07 NOTE — Telephone Encounter (Signed)
 Called patient, no answer, lvm for patient letting him know that the appointment that was scheduled had to be canceled due to a dismissal. I, also let him know that there was a letter in the mail explaining why he was dismissed. Dismissed in [place]. Letter in mail.

## 2024-01-07 NOTE — Telephone Encounter (Signed)
 Pt with 3 no shows dated 11/30/22, 09/06/23, and 01/07/24. Dismissed from practice due to breach in no show policy.

## 2024-01-11 ENCOUNTER — Ambulatory Visit: Payer: PRIVATE HEALTH INSURANCE | Admitting: Family Medicine

## 2024-01-25 ENCOUNTER — Encounter: Payer: Self-pay | Admitting: Sports Medicine

## 2024-03-17 ENCOUNTER — Telehealth: Payer: Self-pay | Admitting: Family Medicine

## 2024-03-17 NOTE — Telephone Encounter (Signed)
 Copied from CRM [id number]. Topic: Clinical - Lab/Test Results >> Mar 17, 2024 10:38 AM Amy B wrote: Reason for CRM: Patient would like to know if provider can order an A1C.  It is required for his new job.  Please call [phone]

## 2024-03-17 NOTE — Telephone Encounter (Signed)
 Call Patient, no answer, Lvm for the patient to call back to schedule a follow up appointment for diabetes.
